# Patient Record
Sex: Male | Born: 1960 | Race: White | Hispanic: No | Marital: Married | Smoking: Former smoker
Health system: Southern US, Community
[De-identification: ages and names within clinical notes are randomized; demographics above are authoritative.]

## PROBLEM LIST (undated history)

## (undated) DIAGNOSIS — G4730 Sleep apnea, unspecified: Secondary | ICD-10-CM

## (undated) DIAGNOSIS — E119 Type 2 diabetes mellitus without complications: Secondary | ICD-10-CM

## (undated) DIAGNOSIS — F419 Anxiety disorder, unspecified: Secondary | ICD-10-CM

## (undated) DIAGNOSIS — M1990 Unspecified osteoarthritis, unspecified site: Secondary | ICD-10-CM

## (undated) DIAGNOSIS — F32A Depression, unspecified: Secondary | ICD-10-CM

## (undated) DIAGNOSIS — I10 Essential (primary) hypertension: Secondary | ICD-10-CM

## (undated) DIAGNOSIS — F329 Major depressive disorder, single episode, unspecified: Secondary | ICD-10-CM

## (undated) HISTORY — PX: TONSILLECTOMY: SUR1361

---

## 2012-12-01 HISTORY — PX: SHOULDER ARTHROSCOPY W/ ROTATOR CUFF REPAIR: SHX2400

## 2017-05-21 DIAGNOSIS — M542 Cervicalgia: Secondary | ICD-10-CM | POA: Diagnosis not present

## 2017-05-21 DIAGNOSIS — M9903 Segmental and somatic dysfunction of lumbar region: Secondary | ICD-10-CM | POA: Diagnosis not present

## 2017-05-21 DIAGNOSIS — M5416 Radiculopathy, lumbar region: Secondary | ICD-10-CM | POA: Diagnosis not present

## 2017-05-21 DIAGNOSIS — M9905 Segmental and somatic dysfunction of pelvic region: Secondary | ICD-10-CM | POA: Diagnosis not present

## 2017-07-15 DIAGNOSIS — E1169 Type 2 diabetes mellitus with other specified complication: Secondary | ICD-10-CM | POA: Diagnosis not present

## 2017-07-15 DIAGNOSIS — E669 Obesity, unspecified: Secondary | ICD-10-CM | POA: Diagnosis not present

## 2017-07-15 DIAGNOSIS — E785 Hyperlipidemia, unspecified: Secondary | ICD-10-CM | POA: Diagnosis not present

## 2017-07-15 DIAGNOSIS — I10 Essential (primary) hypertension: Secondary | ICD-10-CM | POA: Diagnosis not present

## 2017-07-15 DIAGNOSIS — K709 Alcoholic liver disease, unspecified: Secondary | ICD-10-CM | POA: Diagnosis not present

## 2017-08-05 DIAGNOSIS — H401131 Primary open-angle glaucoma, bilateral, mild stage: Secondary | ICD-10-CM | POA: Diagnosis not present

## 2017-08-25 DIAGNOSIS — M9903 Segmental and somatic dysfunction of lumbar region: Secondary | ICD-10-CM | POA: Diagnosis not present

## 2017-08-25 DIAGNOSIS — M542 Cervicalgia: Secondary | ICD-10-CM | POA: Diagnosis not present

## 2017-08-25 DIAGNOSIS — M5416 Radiculopathy, lumbar region: Secondary | ICD-10-CM | POA: Diagnosis not present

## 2017-08-25 DIAGNOSIS — M9905 Segmental and somatic dysfunction of pelvic region: Secondary | ICD-10-CM | POA: Diagnosis not present

## 2017-08-26 DIAGNOSIS — Z23 Encounter for immunization: Secondary | ICD-10-CM | POA: Diagnosis not present

## 2017-08-26 DIAGNOSIS — M5431 Sciatica, right side: Secondary | ICD-10-CM | POA: Diagnosis not present

## 2017-08-26 DIAGNOSIS — M545 Low back pain: Secondary | ICD-10-CM | POA: Diagnosis not present

## 2017-08-26 DIAGNOSIS — G8929 Other chronic pain: Secondary | ICD-10-CM | POA: Diagnosis not present

## 2017-08-28 DIAGNOSIS — G8929 Other chronic pain: Secondary | ICD-10-CM | POA: Diagnosis not present

## 2017-08-28 DIAGNOSIS — S239XXA Sprain of unspecified parts of thorax, initial encounter: Secondary | ICD-10-CM | POA: Diagnosis not present

## 2017-08-28 DIAGNOSIS — M545 Low back pain: Secondary | ICD-10-CM | POA: Diagnosis not present

## 2017-08-28 DIAGNOSIS — M5431 Sciatica, right side: Secondary | ICD-10-CM | POA: Diagnosis not present

## 2017-08-29 DIAGNOSIS — M5431 Sciatica, right side: Secondary | ICD-10-CM | POA: Diagnosis not present

## 2017-08-29 DIAGNOSIS — M549 Dorsalgia, unspecified: Secondary | ICD-10-CM | POA: Diagnosis not present

## 2017-09-07 DIAGNOSIS — M545 Low back pain: Secondary | ICD-10-CM | POA: Diagnosis not present

## 2017-09-07 DIAGNOSIS — M5431 Sciatica, right side: Secondary | ICD-10-CM | POA: Diagnosis not present

## 2017-09-12 DIAGNOSIS — M549 Dorsalgia, unspecified: Secondary | ICD-10-CM | POA: Diagnosis not present

## 2017-09-12 DIAGNOSIS — M5416 Radiculopathy, lumbar region: Secondary | ICD-10-CM | POA: Diagnosis not present

## 2017-09-12 DIAGNOSIS — G894 Chronic pain syndrome: Secondary | ICD-10-CM | POA: Diagnosis not present

## 2017-09-12 DIAGNOSIS — M545 Low back pain: Secondary | ICD-10-CM | POA: Diagnosis not present

## 2017-09-15 DIAGNOSIS — M4316 Spondylolisthesis, lumbar region: Secondary | ICD-10-CM | POA: Diagnosis not present

## 2017-09-17 DIAGNOSIS — M4316 Spondylolisthesis, lumbar region: Secondary | ICD-10-CM | POA: Diagnosis not present

## 2017-09-17 DIAGNOSIS — M545 Low back pain: Secondary | ICD-10-CM | POA: Diagnosis not present

## 2017-09-18 DIAGNOSIS — M4316 Spondylolisthesis, lumbar region: Secondary | ICD-10-CM | POA: Diagnosis not present

## 2017-09-23 DIAGNOSIS — M5431 Sciatica, right side: Secondary | ICD-10-CM | POA: Diagnosis not present

## 2017-09-23 DIAGNOSIS — M4310 Spondylolisthesis, site unspecified: Secondary | ICD-10-CM | POA: Diagnosis not present

## 2017-10-01 DIAGNOSIS — M9905 Segmental and somatic dysfunction of pelvic region: Secondary | ICD-10-CM | POA: Diagnosis not present

## 2017-10-01 DIAGNOSIS — M9903 Segmental and somatic dysfunction of lumbar region: Secondary | ICD-10-CM | POA: Diagnosis not present

## 2017-10-01 DIAGNOSIS — M542 Cervicalgia: Secondary | ICD-10-CM | POA: Diagnosis not present

## 2017-10-01 DIAGNOSIS — M5416 Radiculopathy, lumbar region: Secondary | ICD-10-CM | POA: Diagnosis not present

## 2017-10-07 DIAGNOSIS — M9983 Other biomechanical lesions of lumbar region: Secondary | ICD-10-CM | POA: Diagnosis not present

## 2017-10-07 DIAGNOSIS — M48061 Spinal stenosis, lumbar region without neurogenic claudication: Secondary | ICD-10-CM | POA: Diagnosis not present

## 2017-10-07 DIAGNOSIS — M5416 Radiculopathy, lumbar region: Secondary | ICD-10-CM | POA: Diagnosis not present

## 2017-10-07 DIAGNOSIS — M4306 Spondylolysis, lumbar region: Secondary | ICD-10-CM | POA: Diagnosis not present

## 2017-10-07 DIAGNOSIS — G894 Chronic pain syndrome: Secondary | ICD-10-CM | POA: Diagnosis not present

## 2017-10-12 DIAGNOSIS — I10 Essential (primary) hypertension: Secondary | ICD-10-CM | POA: Diagnosis not present

## 2017-10-12 DIAGNOSIS — M4310 Spondylolisthesis, site unspecified: Secondary | ICD-10-CM | POA: Diagnosis not present

## 2017-10-12 DIAGNOSIS — M7138 Other bursal cyst, other site: Secondary | ICD-10-CM | POA: Diagnosis not present

## 2017-10-14 DIAGNOSIS — M4307 Spondylolysis, lumbosacral region: Secondary | ICD-10-CM | POA: Diagnosis not present

## 2017-10-14 DIAGNOSIS — I10 Essential (primary) hypertension: Secondary | ICD-10-CM | POA: Diagnosis not present

## 2017-10-14 DIAGNOSIS — M7138 Other bursal cyst, other site: Secondary | ICD-10-CM | POA: Diagnosis not present

## 2017-10-14 DIAGNOSIS — M4310 Spondylolisthesis, site unspecified: Secondary | ICD-10-CM | POA: Diagnosis not present

## 2017-10-16 ENCOUNTER — Other Ambulatory Visit: Payer: Self-pay | Admitting: Neurosurgery

## 2017-10-19 DIAGNOSIS — W19XXXA Unspecified fall, initial encounter: Secondary | ICD-10-CM | POA: Diagnosis not present

## 2017-10-19 DIAGNOSIS — S2241XA Multiple fractures of ribs, right side, initial encounter for closed fracture: Secondary | ICD-10-CM | POA: Diagnosis not present

## 2017-11-05 NOTE — Pre-Procedure Instructions (Signed)
Larry GrillsCraig Mora  11/05/2017      [address] [address] - [place], N - [place], [place] - [address] [place] [address] [place] [address] Phone: [phone] Fax: [phone]    Your procedure is scheduled on Dec 11  [facility] at 950 A.M.  Call this number if you have problems the morning of surgery:  [phone]   Remember:  Do not eat food or drink liquids after midnight.  Take these medicines the morning of surgery with A SIP OF WATER Escitalopram (lexapro), Metoprolol Succinate (Toprol-XL)  Stop taking aspirin, BC's, Goody's, Herbal medications, Fish Oil, Ibuprofen, Advil, Motrin, Aleve    How to Manage Your Diabetes Before and After Surgery  Why is it important to control my blood sugar before and after surgery? . Improving blood sugar levels before and after surgery helps healing and can limit problems. . A way of improving blood sugar control is eating a healthy diet by: o  Eating less sugar and carbohydrates o  Increasing activity/exercise o  Talking with your doctor about reaching your blood sugar goals . High blood sugars (greater than 180 mg/dL) can raise your risk of infections and slow your recovery, so you will need to focus on controlling your diabetes during the weeks before surgery. . Make sure that the doctor who takes care of your diabetes knows about your planned surgery including the date and location.  How do I manage my blood sugar before surgery? . Check your blood sugar at least 4 times a day, starting 2 days before surgery, to make sure that the level is not too high or low. o Check your blood sugar the morning of your surgery when you wake up and every 2 hours until you get to the Short Stay unit. . If your blood sugar is less than 70 mg/dL, you will need to treat for low blood sugar: o Do not take insulin. o Treat a low blood sugar (less than 70 mg/dL) with  cup of clear juice (cranberry or apple), 4 glucose tablets,  OR glucose gel. Recheck blood sugar in 15 minutes after treatment (to make sure it is greater than 70 mg/dL). If your blood sugar is not greater than 70 mg/dL on recheck, call [phone] o  for further instructions. . Report your blood sugar to the short stay nurse when you get to Short Stay.  . If you are admitted to the hospital after surgery: o Your blood sugar will be checked by the staff and you will probably be given insulin after surgery (instead of oral diabetes medicines) to make sure you have good blood sugar levels. o The goal for blood sugar control after surgery is 80-180 mg/dL.              WHAT DO I DO ABOUT MY DIABETES MEDICATION?   Marland Kitchen. Do not take oral diabetes medicines (pills) the morning of surgery.  . THE NIGHT BEFORE SURGERY, take ___________ units of ___________insulin.       Marland Kitchen. HE MORNING OF SURGERY, take _____________ units of __________insulin.  . The day of surgery, do not take other diabetes injectables, including Byetta (exenatide), Bydureon (exenatide ER), Victoza (liraglutide), or Trulicity (dulaglutide).  . If your CBG is greater than 220 mg/dL, you may take  of your sliding scale (correction) dose of insulin.  Other Instructions:          Patient Signature:  Date:   Nurse Signature:  Date:   Reviewed and Endorsed by [facility] Patient Education Committee, August 2015  Do not wear jewelry, make-up or nail polish.  Do not wear lotions, powders, or perfumes, or deoderant.  Do not shave 48 hours prior to surgery.  Men may shave face and neck.  Do not bring valuables to the hospital.  [facility] is not responsible for any belongings or valuables.  Contacts, dentures or bridgework may not be worn into surgery.  Leave your suitcase in the car.  After surgery it may be brought to your room.  For patients admitted to the hospital, discharge time will be determined by your treatment team.  Patients discharged the day of surgery will not  be allowed to drive home.    Special instructions:  [facility] - Preparing for Surgery  Before surgery, you can play an important role.  Because skin is not sterile, your skin needs to be as free of germs as possible.  You can reduce the number of germs on you skin by washing with CHG (chlorahexidine gluconate) soap before surgery.  CHG is an antiseptic cleaner which kills germs and bonds with the skin to continue killing germs even after washing.  Please DO NOT use if you have an allergy to CHG or antibacterial soaps.  If your skin becomes reddened/irritated stop using the CHG and inform your nurse when you arrive at Short Stay.  Do not shave (including legs and underarms) for at least 48 hours prior to the first CHG shower.  You may shave your face.  Please follow these instructions carefully:   1.  Shower with CHG Soap the night before surgery and the   morning of Surgery.  2.  If you choose to wash your hair, wash your hair first as usual with your  normal shampoo.  3.  After you shampoo, rinse your hair and body thoroughly to remove the  Shampoo.  4.  Use CHG as you would any other liquid soap.  You can apply chg directly to the skin and wash gently with scrungie or a clean washcloth.  5.  Apply the CHG Soap to your body ONLY FROM THE NECK DOWN.  Do not use on open wounds or open sores.  Avoid contact with your eyes,   ears, mouth and genitals (private parts).  Wash genitals (private parts)  with your normal soap.  6.  Wash thoroughly, paying special attention to the area where your surgery  will be performed.  7.  Thoroughly rinse your body with warm water from the neck down.  8.  DO NOT shower/wash with your normal soap after using and rinsing off the CHG Soap.  9.  Pat yourself dry with a clean towel.            10.  Wear clean pajamas.            11.  Place clean sheets on your bed the night of your first shower and do not sleep with pets.  Day of Surgery  Do not apply any  lotions/deoderants the morning of surgery.  Please wear clean clothes to the hospital/surgery center.     Please read over the following fact sheets that you were given. Pain Booklet, Coughing and Deep Breathing, MRSA Information and Surgical Site Infection Prevention

## 2017-11-06 ENCOUNTER — Other Ambulatory Visit: Payer: Self-pay

## 2017-11-06 ENCOUNTER — Encounter (HOSPITAL_COMMUNITY)
Admission: RE | Admit: 2017-11-06 | Discharge: 2017-11-06 | Disposition: A | Discharge status: Home / Self Care | Payer: BLUE CROSS/BLUE SHIELD | Source: Ambulatory Visit | Attending: Neurosurgery | Admitting: Neurosurgery

## 2017-11-06 ENCOUNTER — Encounter (HOSPITAL_COMMUNITY): Payer: Self-pay

## 2017-11-06 DIAGNOSIS — M4317 Spondylolisthesis, lumbosacral region: Secondary | ICD-10-CM | POA: Insufficient documentation

## 2017-11-06 DIAGNOSIS — Z01810 Encounter for preprocedural cardiovascular examination: Secondary | ICD-10-CM | POA: Insufficient documentation

## 2017-11-06 DIAGNOSIS — Z794 Long term (current) use of insulin: Secondary | ICD-10-CM | POA: Insufficient documentation

## 2017-11-06 DIAGNOSIS — Z01818 Encounter for other preprocedural examination: Secondary | ICD-10-CM | POA: Insufficient documentation

## 2017-11-06 DIAGNOSIS — Z79899 Other long term (current) drug therapy: Secondary | ICD-10-CM | POA: Insufficient documentation

## 2017-11-06 HISTORY — DX: Type 2 diabetes mellitus without complications: E11.9

## 2017-11-06 HISTORY — DX: Depression, unspecified: F32.A

## 2017-11-06 HISTORY — DX: Major depressive disorder, single episode, unspecified: F32.9

## 2017-11-06 HISTORY — DX: Sleep apnea, unspecified: G47.30

## 2017-11-06 HISTORY — DX: Unspecified osteoarthritis, unspecified site: M19.90

## 2017-11-06 HISTORY — DX: Essential (primary) hypertension: I10

## 2017-11-06 HISTORY — DX: Anxiety disorder, unspecified: F41.9

## 2017-11-06 LAB — BASIC METABOLIC PANEL
ANION GAP: 9 (ref 5–15)
BUN: 8 mg/dL (ref 6–20)
CALCIUM: 9.6 mg/dL (ref 8.9–10.3)
CO2: 28 mmol/L (ref 22–32)
Chloride: 95 mmol/L — ABNORMAL LOW (ref 101–111)
Creatinine, Ser: 0.62 mg/dL (ref 0.61–1.24)
GLUCOSE: 123 mg/dL — AB (ref 65–99)
Potassium: 4 mmol/L (ref 3.5–5.1)
Sodium: 132 mmol/L — ABNORMAL LOW (ref 135–145)

## 2017-11-06 LAB — CBC
HCT: 42 % (ref 39.0–52.0)
Hemoglobin: 14.7 g/dL (ref 13.0–17.0)
MCH: 30.2 pg (ref 26.0–34.0)
MCHC: 35 g/dL (ref 30.0–36.0)
MCV: 86.4 fL (ref 78.0–100.0)
PLATELETS: 334 10*3/uL (ref 150–400)
RBC: 4.86 MIL/uL (ref 4.22–5.81)
RDW: 15.7 % — AB (ref 11.5–15.5)
WBC: 7.3 10*3/uL (ref 4.0–10.5)

## 2017-11-06 LAB — ABO/RH: ABO/RH(D): A NEG

## 2017-11-06 LAB — TYPE AND SCREEN
ABO/RH(D): A NEG
Antibody Screen: NEGATIVE

## 2017-11-06 LAB — HEMOGLOBIN A1C
Hgb A1c MFr Bld: 5.2 % (ref 4.8–5.6)
MEAN PLASMA GLUCOSE: 102.54 mg/dL

## 2017-11-06 LAB — SURGICAL PCR SCREEN
MRSA, PCR: NEGATIVE
Staphylococcus aureus: NEGATIVE

## 2017-11-06 LAB — GLUCOSE, CAPILLARY: GLUCOSE-CAPILLARY: 118 mg/dL — AB (ref 65–99)

## 2017-11-06 NOTE — Pre-Procedure Instructions (Signed)
  Larry Mora  11/06/2017      [facility] - [place], [place] - [place], [place] - [address] [place] [address] Phone: [phone] Fax: [phone]    Your procedure is scheduled on Dec 11  Report to [facility] Admitting at 950 A.M.  Call this number if you have problems the morning of surgery:  [phone]   Remember:  Do not eat food or drink liquids after midnight.  Take these medicines the morning of surgery with A SIP OF WATER Escitalopram (lexapro), Metoprolol Succinate (Toprol-XL)  Stop taking aspirin, BC's, Goody's, Herbal medications, Fish Oil, Ibuprofen, Advil, Motrin, Aleve,naproxen   How to Manage Your Diabetes Before and After Surgery  Why is it important to control my blood sugar before and after surgery? . Improving blood sugar levels before and after surgery helps healing and can limit problems. . A way of improving blood sugar control is eating a healthy diet by: o  Eating less sugar and carbohydrates o  Increasing activity/exercise o  Talking with your doctor about reaching your blood sugar goals . High blood sugars (greater than 180 mg/dL) can raise your risk of infections and slow your recovery, so you will need to focus on controlling your diabetes during the weeks before surgery. . Make sure that the doctor who takes care of your diabetes knows about your planned surgery including the date and location.  How do I manage my blood sugar before surgery? . Check your blood sugar at least 4 times a day, starting 2 days before surgery, to make sure that the level is not too high or low. o Check your blood sugar the morning of your surgery when you wake up and every 2 hours until you get to the Short Stay unit. . If your blood sugar is less than 70 mg/dL, you will need to treat for low blood sugar: o Do not take insulin. o Treat a low blood sugar (less than 70 mg/dL) with  cup of clear juice (cranberry or apple), 4 glucose  tablets, OR glucose gel. Recheck blood sugar in 15 minutes after treatment (to make sure it is greater than 70 mg/dL). If your blood sugar is not greater than 70 mg/dL on recheck, call [phone] o  for further instructions. . Report your blood sugar to the short stay nurse when you get to Short Stay.  . If you are admitted to the hospital after surgery: o Your blood sugar will be checked by the staff and you will probably be given insulin after surgery (instead of oral diabetes medicines) to make sure you have good blood sugar levels. o The goal for blood sugar control after surgery is 80-180 mg/dL.   . THE MORNING OF SURGERY, take 2 units of lantus insulin. .    Do not wear jewelry, make-up or nail polish.  Do not wear lotions, powders, or perfumes, or deoderant.  Do not shave 48 hours prior to surgery.  Men may shave face and neck.  Do not bring valuables to the hospital.  [name] is not responsible for any belongings or valuables.  Contacts, dentures or bridgework may not be worn into surgery.  Leave your suitcase in the car.  After surgery it may be brought to your room.  For patients admitted to the hospital, discharge time will be determined by your treatment team.  Patients discharged the day of surgery will not be allowed to drive home.      Special instructions:  [place] - Preparing for Surgery  Before surgery, you can play an important role.  Because skin is not sterile, your skin needs to be as free of germs as possible.  You can reduce the number of germs on you skin by washing with CHG (chlorahexidine gluconate) soap before surgery.  CHG is an antiseptic cleaner which kills germs and bonds with the skin to continue killing germs even after washing.  Please DO NOT use if you have an allergy to CHG or antibacterial soaps.  If your skin becomes reddened/irritated stop using the CHG and inform your nurse when you arrive at Short Stay.  Do not shave (including legs  and underarms) for at least 48 hours prior to the first CHG shower.  You may shave your face.  Please follow these instructions carefully:   1.  Shower with CHG Soap the night before surgery and the   morning of Surgery.  2.  If you choose to wash your hair, wash your hair first as usual with your  normal shampoo.  3.  After you shampoo, rinse your hair and body thoroughly to remove the  Shampoo.  4.  Use CHG as you would any other liquid soap.  You can apply chg directly to the skin and wash gently with scrungie or a clean washcloth.  5.  Apply the CHG Soap to your body ONLY FROM THE NECK DOWN.  Do not use on open wounds or open sores.  Avoid contact with your eyes,   ears, mouth and genitals (private parts).  Wash genitals (private parts)  with your normal soap.  6.  Wash thoroughly, paying special attention to the area where your surgery  will be performed.  7.  Thoroughly rinse your body with warm water from the neck down.  8.  DO NOT shower/wash with your normal soap after using and rinsing off the CHG Soap.  9.  Pat yourself dry with a clean towel.            10.  Wear clean pajamas.            11.  Place clean sheets on your bed the night of your first shower and do not sleep with pets.  Day of Surgery  Do not apply any lotions/deoderants the morning of surgery.  Please wear clean clothes to the hospital/surgery center.     Please read over the  fact sheets that you were given.

## 2017-11-06 NOTE — Pre-Procedure Instructions (Signed)
Verlin GrillsCraig Harker  11/06/2017      [facility] [place], [place] - [place], [place] - [address] [place] [address] Phone: [phone] Fax: [phone]    Your procedure is scheduled on Dec 11  [facility] at 950 A.M.  Call this number if you have problems the morning of surgery:  [phone]   Remember:  Do not eat food or drink liquids after midnight.  Take these medicines the morning of surgery with A SIP OF WATER Escitalopram (lexapro), Metoprolol Succinate (Toprol-XL)  Stop taking aspirin, BC's, Goody's, Herbal medications, Fish Oil, Ibuprofen, Advil, Motrin, Aleve,naproxen   How to Manage Your Diabetes Before and After Surgery  Why is it important to control my blood sugar before and after surgery? . Improving blood sugar levels before and after surgery helps healing and can limit problems. . A way of improving blood sugar control is eating a healthy diet by: o  Eating less sugar and carbohydrates o  Increasing activity/exercise o  Talking with your doctor about reaching your blood sugar goals . High blood sugars (greater than 180 mg/dL) can raise your risk of infections and slow your recovery, so you will need to focus on controlling your diabetes during the weeks before surgery. . Make sure that the doctor who takes care of your diabetes knows about your planned surgery including the date and location.  How do I manage my blood sugar before surgery? . Check your blood sugar at least 4 times a day, starting 2 days before surgery, to make sure that the level is not too high or low. o Check your blood sugar the morning of your surgery when you wake up and every 2 hours until you get to the Short Stay unit. . If your blood sugar is less than 70 mg/dL, you will need to treat for low blood sugar: o Do not take insulin. o Treat a low blood sugar (less than 70 mg/dL) with  cup of clear juice (cranberry or apple), 4 glucose  tablets, OR glucose gel. Recheck blood sugar in 15 minutes after treatment (to make sure it is greater than 70 mg/dL). If your blood sugar is not greater than 70 mg/dL on recheck, call [phone] o  for further instructions. . Report your blood sugar to the short stay nurse when you get to Short Stay.  . If you are admitted to the hospital after surgery: o Your blood sugar will be checked by the staff and you will probably be given insulin after surgery (instead of oral diabetes medicines) to make sure you have good blood sugar levels. o The goal for blood sugar control after surgery is 80-180 mg/dL.   . THE MORNING OF SURGERY, take 2 units of lantus insulin. .    Do not wear jewelry, make-up or nail polish.  Do not wear lotions, powders, or perfumes, or deoderant.  Do not shave 48 hours prior to surgery.  Men may shave face and neck.  Do not bring valuables to the hospital.  [facility] is not responsible for any belongings or valuables.  Contacts, dentures or bridgework may not be worn into surgery.  Leave your suitcase in the car.  After surgery it may be brought to your room.  For patients admitted to the hospital, discharge time will be determined by your treatment team.  Patients discharged the day of surgery will not be allowed to drive home.  Special instructions:  [place] - Preparing for Surgery  Before surgery, you can play an important role.  Because skin is not sterile, your skin needs to be as free of germs as possible.  You can reduce the number of germs on you skin by washing with CHG (chlorahexidine gluconate) soap before surgery.  CHG is an antiseptic cleaner which kills germs and bonds with the skin to continue killing germs even after washing.  Please DO NOT use if you have an allergy to CHG or antibacterial soaps.  If your skin becomes reddened/irritated stop using the CHG and inform your nurse when you arrive at Short Stay.  Do not shave (including legs  and underarms) for at least 48 hours prior to the first CHG shower.  You may shave your face.  Please follow these instructions carefully:   1.  Shower with CHG Soap the night before surgery and the   morning of Surgery.  2.  If you choose to wash your hair, wash your hair first as usual with your  normal shampoo.  3.  After you shampoo, rinse your hair and body thoroughly to remove the  Shampoo.  4.  Use CHG as you would any other liquid soap.  You can apply chg directly to the skin and wash gently with scrungie or a clean washcloth.  5.  Apply the CHG Soap to your body ONLY FROM THE NECK DOWN.  Do not use on open wounds or open sores.  Avoid contact with your eyes,   ears, mouth and genitals (private parts).  Wash genitals (private parts)  with your normal soap.  6.  Wash thoroughly, paying special attention to the area where your surgery  will be performed.  7.  Thoroughly rinse your body with warm water from the neck down.  8.  DO NOT shower/wash with your normal soap after using and rinsing off the CHG Soap.  9.  Pat yourself dry with a clean towel.            10.  Wear clean pajamas.            11.  Place clean sheets on your bed the night of your first shower and do not sleep with pets.  Day of Surgery  Do not apply any lotions/deoderants the morning of surgery.  Please wear clean clothes to the hospital/surgery center.     Please read over the  fact sheets that you were given.

## 2017-11-09 ENCOUNTER — Encounter (HOSPITAL_COMMUNITY): Payer: Self-pay | Admitting: Anesthesiology

## 2017-11-09 NOTE — Anesthesia Preprocedure Evaluation (Addendum)
Anesthesia Evaluation  Patient identified by MRN, date of birth, ID band Patient awake    Reviewed: Allergy & Precautions, NPO status , Patient's Chart, lab work & pertinent test results  Airway Mallampati: I       Dental no notable dental hx. (+) Teeth Intact   Pulmonary former smoker,    Pulmonary exam normal breath sounds clear to auscultation       Cardiovascular hypertension, Pt. on medications and Pt. on home beta blockers Normal cardiovascular exam Rhythm:Regular Rate:Normal     Neuro/Psych PSYCHIATRIC DISORDERS Anxiety Depression    GI/Hepatic negative GI ROS, Neg liver ROS,   Endo/Other  diabetes, Insulin Dependent  Renal/GU negative Renal ROS  negative genitourinary   Musculoskeletal   Abdominal (+) + obese,   Peds  Hematology   Anesthesia Other Findings   Reproductive/Obstetrics                            Anesthesia Physical Anesthesia Plan  ASA: II  Anesthesia Plan: General   Post-op Pain Management:    Induction: Intravenous  PONV Risk Score and Plan: 3 and Midazolam, Dexamethasone and Ondansetron  Airway Management Planned: Oral ETT  Additional Equipment:   Intra-op Plan:   Post-operative Plan: Extubation in OR  Informed Consent: I have reviewed the patients History and Physical, chart, labs and discussed the procedure including the risks, benefits and alternatives for the proposed anesthesia with the patient or authorized representative who has indicated his/her understanding and acceptance.   Dental advisory given  Plan Discussed with: CRNA and Surgeon  Anesthesia Plan Comments:        Anesthesia Quick Evaluation

## 2017-11-10 ENCOUNTER — Inpatient Hospital Stay (HOSPITAL_COMMUNITY)
Admission: RE | Admit: 2017-11-10 | Discharge: 2017-11-11 | DRG: 455 | Disposition: A | Discharge status: Home / Self Care | Payer: BLUE CROSS/BLUE SHIELD | Source: Ambulatory Visit | Attending: Neurosurgery | Admitting: Neurosurgery

## 2017-11-10 ENCOUNTER — Inpatient Hospital Stay (HOSPITAL_COMMUNITY): Payer: BLUE CROSS/BLUE SHIELD | Admitting: Anesthesiology

## 2017-11-10 ENCOUNTER — Encounter (HOSPITAL_COMMUNITY)
Admission: RE | Disposition: A | Discharge status: Home / Self Care | Payer: Self-pay | Source: Ambulatory Visit | Attending: Neurosurgery

## 2017-11-10 ENCOUNTER — Encounter (HOSPITAL_COMMUNITY): Payer: Self-pay | Admitting: *Deleted

## 2017-11-10 ENCOUNTER — Inpatient Hospital Stay (HOSPITAL_COMMUNITY): Payer: BLUE CROSS/BLUE SHIELD

## 2017-11-10 DIAGNOSIS — M48061 Spinal stenosis, lumbar region without neurogenic claudication: Secondary | ICD-10-CM | POA: Diagnosis present

## 2017-11-10 DIAGNOSIS — F418 Other specified anxiety disorders: Secondary | ICD-10-CM | POA: Diagnosis not present

## 2017-11-10 DIAGNOSIS — F419 Anxiety disorder, unspecified: Secondary | ICD-10-CM | POA: Diagnosis not present

## 2017-11-10 DIAGNOSIS — I10 Essential (primary) hypertension: Secondary | ICD-10-CM | POA: Diagnosis not present

## 2017-11-10 DIAGNOSIS — Z419 Encounter for procedure for purposes other than remedying health state, unspecified: Secondary | ICD-10-CM

## 2017-11-10 DIAGNOSIS — M7138 Other bursal cyst, other site: Secondary | ICD-10-CM | POA: Diagnosis present

## 2017-11-10 DIAGNOSIS — G4730 Sleep apnea, unspecified: Secondary | ICD-10-CM | POA: Diagnosis present

## 2017-11-10 DIAGNOSIS — Z79899 Other long term (current) drug therapy: Secondary | ICD-10-CM | POA: Diagnosis not present

## 2017-11-10 DIAGNOSIS — M1990 Unspecified osteoarthritis, unspecified site: Secondary | ICD-10-CM | POA: Diagnosis present

## 2017-11-10 DIAGNOSIS — M4316 Spondylolisthesis, lumbar region: Secondary | ICD-10-CM | POA: Diagnosis present

## 2017-11-10 DIAGNOSIS — E119 Type 2 diabetes mellitus without complications: Secondary | ICD-10-CM | POA: Diagnosis not present

## 2017-11-10 DIAGNOSIS — Z87891 Personal history of nicotine dependence: Secondary | ICD-10-CM | POA: Diagnosis not present

## 2017-11-10 DIAGNOSIS — Z794 Long term (current) use of insulin: Secondary | ICD-10-CM | POA: Diagnosis not present

## 2017-11-10 DIAGNOSIS — F329 Major depressive disorder, single episode, unspecified: Secondary | ICD-10-CM | POA: Diagnosis present

## 2017-11-10 DIAGNOSIS — M4327 Fusion of spine, lumbosacral region: Secondary | ICD-10-CM | POA: Diagnosis not present

## 2017-11-10 DIAGNOSIS — M5416 Radiculopathy, lumbar region: Secondary | ICD-10-CM | POA: Diagnosis present

## 2017-11-10 DIAGNOSIS — M4317 Spondylolisthesis, lumbosacral region: Secondary | ICD-10-CM | POA: Diagnosis not present

## 2017-11-10 DIAGNOSIS — M5417 Radiculopathy, lumbosacral region: Secondary | ICD-10-CM | POA: Diagnosis not present

## 2017-11-10 DIAGNOSIS — M545 Low back pain: Secondary | ICD-10-CM | POA: Diagnosis not present

## 2017-11-10 LAB — GLUCOSE, CAPILLARY
Glucose-Capillary: 108 mg/dL — ABNORMAL HIGH (ref 65–99)
Glucose-Capillary: 146 mg/dL — ABNORMAL HIGH (ref 65–99)
Glucose-Capillary: 177 mg/dL — ABNORMAL HIGH (ref 65–99)
Glucose-Capillary: 172 mg/dL — ABNORMAL HIGH (ref 65–99)

## 2017-11-10 SURGERY — POSTERIOR LUMBAR FUSION 1 LEVEL
Anesthesia: General | Site: Spine Lumbar

## 2017-11-10 MED ORDER — MIDAZOLAM HCL 5 MG/5ML IJ SOLN
INTRAMUSCULAR | Status: DC | PRN
Start: 2017-11-10 — End: 2017-11-10
  Administered 2017-11-10: 2 mg via INTRAVENOUS

## 2017-11-10 MED ORDER — ACETAMINOPHEN 325 MG PO TABS: 325.0000 mg | ORAL_TABLET | ORAL | Status: DC | PRN

## 2017-11-10 MED ORDER — THROMBIN (RECOMBINANT) 5000 UNITS EX SOLR
CUTANEOUS | Status: AC
Start: 2017-11-10 — End: ?
  Filled 2017-11-10: qty 5000

## 2017-11-10 MED ORDER — EPHEDRINE SULFATE 50 MG/ML IJ SOLN
INTRAMUSCULAR | Status: DC | PRN
  Administered 2017-11-10: 5 mg via INTRAVENOUS

## 2017-11-10 MED ORDER — SENNOSIDES-DOCUSATE SODIUM 8.6-50 MG PO TABS: 1.0000 | ORAL_TABLET | Freq: Every evening | ORAL | Status: DC | PRN

## 2017-11-10 MED ORDER — HYDROCODONE-ACETAMINOPHEN 5-325 MG PO TABS: 1.0000 | ORAL_TABLET | ORAL | Status: DC | PRN

## 2017-11-10 MED ORDER — METHOCARBAMOL 1000 MG/10ML IJ SOLN: 500.0000 mg | Freq: Four times a day (QID) | INTRAVENOUS | Status: DC | PRN

## 2017-11-10 MED ORDER — ONDANSETRON HCL 4 MG/2ML IJ SOLN
INTRAMUSCULAR | Status: AC
Start: 2017-11-10 — End: ?
  Filled 2017-11-10: qty 2

## 2017-11-10 MED ORDER — SODIUM CHLORIDE 0.9 % IV SOLN: INTRAVENOUS | Status: DC

## 2017-11-10 MED ORDER — LIDOCAINE 2% (20 MG/ML) 5 ML SYRINGE
INTRAMUSCULAR | Status: AC
  Filled 2017-11-10: qty 5

## 2017-11-10 MED ORDER — DEXAMETHASONE SODIUM PHOSPHATE 10 MG/ML IJ SOLN
INTRAMUSCULAR | Status: DC | PRN
  Administered 2017-11-10: 10 mg via INTRAVENOUS

## 2017-11-10 MED ORDER — ESCITALOPRAM OXALATE 10 MG PO TABS
10.0000 mg | ORAL_TABLET | Freq: Every day | ORAL | Status: DC
  Administered 2017-11-11: 10 mg via ORAL
  Filled 2017-11-10: qty 1

## 2017-11-10 MED ORDER — THROMBIN (RECOMBINANT) 5000 UNITS EX SOLR
CUTANEOUS | Status: DC | PRN
  Administered 2017-11-10 (×2): 5000 [IU] via TOPICAL

## 2017-11-10 MED ORDER — FENTANYL CITRATE (PF) 250 MCG/5ML IJ SOLN
INTRAMUSCULAR | Status: AC
  Filled 2017-11-10: qty 5

## 2017-11-10 MED ORDER — OXYCODONE HCL 5 MG PO TABS
10.0000 mg | ORAL_TABLET | ORAL | Status: DC | PRN
  Administered 2017-11-10 – 2017-11-11 (×5): 10 mg via ORAL
  Filled 2017-11-10 (×5): qty 2

## 2017-11-10 MED ORDER — DOCUSATE SODIUM 100 MG PO CAPS
100.0000 mg | ORAL_CAPSULE | Freq: Two times a day (BID) | ORAL | Status: DC
  Administered 2017-11-10 – 2017-11-11 (×3): 100 mg via ORAL
  Filled 2017-11-10 (×3): qty 1

## 2017-11-10 MED ORDER — ACETAMINOPHEN 325 MG PO TABS: 650.0000 mg | ORAL_TABLET | ORAL | Status: DC | PRN

## 2017-11-10 MED ORDER — PROPOFOL 10 MG/ML IV BOLUS
INTRAVENOUS | Status: AC
Start: 2017-11-10 — End: ?
  Filled 2017-11-10: qty 20

## 2017-11-10 MED ORDER — OXYCODONE HCL 5 MG/5ML PO SOLN: 5.0000 mg | Freq: Once | ORAL | Status: DC | PRN

## 2017-11-10 MED ORDER — CEFAZOLIN SODIUM-DEXTROSE 2-4 GM/100ML-% IV SOLN
2.0000 g | INTRAVENOUS | Status: AC
  Administered 2017-11-10: 2 g via INTRAVENOUS

## 2017-11-10 MED ORDER — SUGAMMADEX SODIUM 200 MG/2ML IV SOLN
INTRAVENOUS | Status: AC
Start: 2017-11-10 — End: ?
  Filled 2017-11-10: qty 2

## 2017-11-10 MED ORDER — ONDANSETRON HCL 4 MG PO TABS: 4.0000 mg | ORAL_TABLET | Freq: Four times a day (QID) | ORAL | Status: DC | PRN

## 2017-11-10 MED ORDER — ONDANSETRON HCL 4 MG/2ML IJ SOLN: 4.0000 mg | Freq: Once | INTRAMUSCULAR | Status: DC | PRN

## 2017-11-10 MED ORDER — SODIUM CHLORIDE 0.9% FLUSH: 3.0000 mL | INTRAVENOUS | Status: DC | PRN

## 2017-11-10 MED ORDER — FENTANYL CITRATE (PF) 100 MCG/2ML IJ SOLN
INTRAMUSCULAR | Status: DC | PRN
  Administered 2017-11-10 (×4): 50 ug via INTRAVENOUS
  Administered 2017-11-10: 150 ug via INTRAVENOUS
  Administered 2017-11-10: 50 ug via INTRAVENOUS

## 2017-11-10 MED ORDER — LACTATED RINGERS IV SOLN
INTRAVENOUS | Status: DC | PRN
  Administered 2017-11-10 (×3): via INTRAVENOUS

## 2017-11-10 MED ORDER — OXYCODONE HCL ER 10 MG PO T12A
10.0000 mg | EXTENDED_RELEASE_TABLET | Freq: Two times a day (BID) | ORAL | Status: DC
  Administered 2017-11-10 – 2017-11-11 (×3): 10 mg via ORAL
  Filled 2017-11-10 (×3): qty 1

## 2017-11-10 MED ORDER — HEMOSTATIC AGENTS (NO CHARGE) OPTIME
TOPICAL | Status: DC | PRN
  Administered 2017-11-10 (×2): 1 via TOPICAL

## 2017-11-10 MED ORDER — THROMBIN (RECOMBINANT) 20000 UNITS EX SOLR
CUTANEOUS | Status: AC
Start: 2017-11-10 — End: ?
  Filled 2017-11-10: qty 20000

## 2017-11-10 MED ORDER — PHENYLEPHRINE HCL 10 MG/ML IJ SOLN
INTRAMUSCULAR | Status: DC | PRN
  Administered 2017-11-10: 80 ug via INTRAVENOUS
  Administered 2017-11-10: 40 ug via INTRAVENOUS
  Administered 2017-11-10 (×3): 80 ug via INTRAVENOUS

## 2017-11-10 MED ORDER — MENTHOL 3 MG MT LOZG: 1.0000 | LOZENGE | OROMUCOSAL | Status: DC | PRN

## 2017-11-10 MED ORDER — ZOLPIDEM TARTRATE 5 MG PO TABS
5.0000 mg | ORAL_TABLET | Freq: Every evening | ORAL | Status: DC | PRN
  Administered 2017-11-10: 5 mg via ORAL
  Filled 2017-11-10: qty 1

## 2017-11-10 MED ORDER — CEFAZOLIN SODIUM-DEXTROSE 2-4 GM/100ML-% IV SOLN
INTRAVENOUS | Status: AC
  Filled 2017-11-10: qty 100

## 2017-11-10 MED ORDER — CHLORHEXIDINE GLUCONATE CLOTH 2 % EX PADS: 6.0000 | MEDICATED_PAD | Freq: Once | CUTANEOUS | Status: DC

## 2017-11-10 MED ORDER — LISINOPRIL 20 MG PO TABS
20.0000 mg | ORAL_TABLET | Freq: Every day | ORAL | Status: DC
  Administered 2017-11-11: 20 mg via ORAL
  Filled 2017-11-10: qty 1

## 2017-11-10 MED ORDER — ONDANSETRON HCL 4 MG/2ML IJ SOLN: 4.0000 mg | Freq: Four times a day (QID) | INTRAMUSCULAR | Status: DC | PRN

## 2017-11-10 MED ORDER — KETOROLAC TROMETHAMINE 30 MG/ML IJ SOLN: 30.0000 mg | Freq: Once | INTRAMUSCULAR | Status: DC | PRN

## 2017-11-10 MED ORDER — PHENOL 1.4 % MT LIQD: 1.0000 | OROMUCOSAL | Status: DC | PRN

## 2017-11-10 MED ORDER — SENNA 8.6 MG PO TABS
1.0000 | ORAL_TABLET | Freq: Two times a day (BID) | ORAL | Status: DC
  Administered 2017-11-10 – 2017-11-11 (×2): 8.6 mg via ORAL
  Filled 2017-11-10 (×2): qty 1

## 2017-11-10 MED ORDER — LACTATED RINGERS IV SOLN
INTRAVENOUS | Status: DC
  Administered 2017-11-10: 07:00:00 via INTRAVENOUS

## 2017-11-10 MED ORDER — DEXAMETHASONE SODIUM PHOSPHATE 10 MG/ML IJ SOLN
INTRAMUSCULAR | Status: AC
Start: 2017-11-10 — End: ?
  Filled 2017-11-10: qty 1

## 2017-11-10 MED ORDER — ATORVASTATIN CALCIUM 20 MG PO TABS
40.0000 mg | ORAL_TABLET | Freq: Every day | ORAL | Status: DC
  Administered 2017-11-10: 40 mg via ORAL
  Filled 2017-11-10: qty 2

## 2017-11-10 MED ORDER — OXYCODONE HCL 5 MG PO TABS: 5.0000 mg | ORAL_TABLET | Freq: Once | ORAL | Status: DC | PRN

## 2017-11-10 MED ORDER — 0.9 % SODIUM CHLORIDE (POUR BTL) OPTIME
TOPICAL | Status: DC | PRN
  Administered 2017-11-10: 1000 mL

## 2017-11-10 MED ORDER — MEPERIDINE HCL 25 MG/ML IJ SOLN: 6.2500 mg | INTRAMUSCULAR | Status: DC | PRN

## 2017-11-10 MED ORDER — ACETAMINOPHEN 500 MG PO TABS
1000.0000 mg | ORAL_TABLET | Freq: Four times a day (QID) | ORAL | Status: AC
  Administered 2017-11-10 – 2017-11-11 (×4): 1000 mg via ORAL
  Filled 2017-11-10 (×4): qty 2

## 2017-11-10 MED ORDER — FENTANYL CITRATE (PF) 100 MCG/2ML IJ SOLN
25.0000 ug | INTRAMUSCULAR | Status: DC | PRN
  Administered 2017-11-10 (×2): 50 ug via INTRAVENOUS

## 2017-11-10 MED ORDER — FENTANYL CITRATE (PF) 100 MCG/2ML IJ SOLN
INTRAMUSCULAR | Status: AC
Start: 2017-11-10 — End: 2017-11-11
  Filled 2017-11-10: qty 2

## 2017-11-10 MED ORDER — FLEET ENEMA 7-19 GM/118ML RE ENEM: 1.0000 | ENEMA | Freq: Once | RECTAL | Status: DC | PRN

## 2017-11-10 MED ORDER — THROMBIN (RECOMBINANT) 5000 UNITS EX SOLR
CUTANEOUS | Status: AC
  Filled 2017-11-10: qty 5000

## 2017-11-10 MED ORDER — SODIUM CHLORIDE 0.9% FLUSH
3.0000 mL | Freq: Two times a day (BID) | INTRAVENOUS | Status: DC
  Administered 2017-11-10 (×2): 3 mL via INTRAVENOUS

## 2017-11-10 MED ORDER — METHOCARBAMOL 500 MG PO TABS
500.0000 mg | ORAL_TABLET | Freq: Four times a day (QID) | ORAL | Status: DC | PRN
  Administered 2017-11-10 – 2017-11-11 (×3): 500 mg via ORAL
  Filled 2017-11-10 (×3): qty 1

## 2017-11-10 MED ORDER — MIDAZOLAM HCL 2 MG/2ML IJ SOLN
INTRAMUSCULAR | Status: AC
Start: 2017-11-10 — End: ?
  Filled 2017-11-10: qty 2

## 2017-11-10 MED ORDER — GABAPENTIN 300 MG PO CAPS
300.0000 mg | ORAL_CAPSULE | Freq: Three times a day (TID) | ORAL | Status: DC
  Administered 2017-11-10 – 2017-11-11 (×3): 300 mg via ORAL
  Filled 2017-11-10 (×3): qty 1

## 2017-11-10 MED ORDER — PROPOFOL 10 MG/ML IV BOLUS
INTRAVENOUS | Status: DC | PRN
Start: 2017-11-10 — End: 2017-11-10
  Administered 2017-11-10: 200 mg via INTRAVENOUS

## 2017-11-10 MED ORDER — ACETAMINOPHEN 650 MG RE SUPP: 650.0000 mg | RECTAL | Status: DC | PRN

## 2017-11-10 MED ORDER — CEFAZOLIN SODIUM-DEXTROSE 2-4 GM/100ML-% IV SOLN
2.0000 g | Freq: Three times a day (TID) | INTRAVENOUS | Status: AC
Start: 2017-11-10 — End: 2017-11-11
  Administered 2017-11-10 – 2017-11-11 (×2): 2 g via INTRAVENOUS
  Filled 2017-11-10 (×2): qty 100

## 2017-11-10 MED ORDER — ONDANSETRON HCL 4 MG/2ML IJ SOLN
INTRAMUSCULAR | Status: DC | PRN
  Administered 2017-11-10: 4 mg via INTRAVENOUS

## 2017-11-10 MED ORDER — SODIUM CHLORIDE 0.9 % IV SOLN: 250.0000 mL | INTRAVENOUS | Status: DC

## 2017-11-10 MED ORDER — LIDOCAINE HCL (CARDIAC) 20 MG/ML IV SOLN
INTRAVENOUS | Status: DC | PRN
  Administered 2017-11-10: 100 mg via INTRAVENOUS

## 2017-11-10 MED ORDER — LIDOCAINE-EPINEPHRINE 1 %-1:100000 IJ SOLN
INTRAMUSCULAR | Status: AC
Start: 2017-11-10 — End: ?
  Filled 2017-11-10: qty 1

## 2017-11-10 MED ORDER — METOPROLOL SUCCINATE ER 25 MG PO TB24
100.0000 mg | ORAL_TABLET | Freq: Every day | ORAL | Status: DC
  Administered 2017-11-11: 100 mg via ORAL
  Filled 2017-11-10: qty 4

## 2017-11-10 MED ORDER — BUPIVACAINE HCL (PF) 0.5 % IJ SOLN
INTRAMUSCULAR | Status: AC
  Filled 2017-11-10: qty 30

## 2017-11-10 MED ORDER — LIDOCAINE-EPINEPHRINE 1 %-1:100000 IJ SOLN
INTRAMUSCULAR | Status: DC | PRN
  Administered 2017-11-10: 5 mL

## 2017-11-10 MED ORDER — ROCURONIUM BROMIDE 10 MG/ML (PF) SYRINGE
PREFILLED_SYRINGE | INTRAVENOUS | Status: AC
  Filled 2017-11-10: qty 5

## 2017-11-10 MED ORDER — PHENYLEPHRINE HCL 10 MG/ML IJ SOLN
INTRAVENOUS | Status: DC | PRN
  Administered 2017-11-10: 40 ug/min via INTRAVENOUS
  Administered 2017-11-10: 30 ug/min via INTRAVENOUS

## 2017-11-10 MED ORDER — ACETAMINOPHEN 160 MG/5ML PO SOLN: 325.0000 mg | ORAL | Status: DC | PRN

## 2017-11-10 MED ORDER — BACITRACIN 50000 UNITS IM SOLR
INTRAMUSCULAR | Status: DC | PRN
  Administered 2017-11-10: 500 mL

## 2017-11-10 MED ORDER — HYDROMORPHONE HCL 1 MG/ML IJ SOLN
1.0000 mg | INTRAMUSCULAR | Status: DC | PRN
  Administered 2017-11-10: 1 mg via INTRAVENOUS
  Filled 2017-11-10: qty 1

## 2017-11-10 MED ORDER — BUPIVACAINE HCL (PF) 0.5 % IJ SOLN
INTRAMUSCULAR | Status: DC | PRN
  Administered 2017-11-10: 5 mL

## 2017-11-10 MED ORDER — BISACODYL 10 MG RE SUPP: 10.0000 mg | Freq: Every day | RECTAL | Status: DC | PRN

## 2017-11-10 MED ORDER — ROCURONIUM BROMIDE 100 MG/10ML IV SOLN
INTRAVENOUS | Status: DC | PRN
  Administered 2017-11-10: 20 mg via INTRAVENOUS
  Administered 2017-11-10: 60 mg via INTRAVENOUS
  Administered 2017-11-10 (×3): 10 mg via INTRAVENOUS
  Administered 2017-11-10: 40 mg via INTRAVENOUS
  Administered 2017-11-10: 10 mg via INTRAVENOUS

## 2017-11-10 MED ORDER — SUGAMMADEX SODIUM 200 MG/2ML IV SOLN
INTRAVENOUS | Status: DC | PRN
  Administered 2017-11-10: 200 mg via INTRAVENOUS

## 2017-11-10 SURGICAL SUPPLY — 80 items
BAG DECANTER FOR FLEXI CONT (MISCELLANEOUS) ×3 IMPLANT
BASKET BONE COLLECTION (BASKET) ×3 IMPLANT
BENZOIN TINCTURE PRP APPL 2/3 (GAUZE/BANDAGES/DRESSINGS) IMPLANT
BIT DRILL 3.5 POWEREASE (BIT) ×2 IMPLANT
BIT DRILL 3.5MM POWEREASE (BIT) ×1
BLADE CLIPPER SURG (BLADE) ×3 IMPLANT
BLADE SURG 11 STRL SS (BLADE) ×3 IMPLANT
BUR MATCHSTICK NEURO 3.0 LAGG (BURR) ×3 IMPLANT
BUR PRECISION FLUTE 5.0 (BURR) ×3 IMPLANT
CANISTER SUCT 3000ML PPV (MISCELLANEOUS) ×3 IMPLANT
CARTRIDGE OIL MAESTRO DRILL (MISCELLANEOUS) ×1 IMPLANT
CLOSURE WOUND 1/2 X4 (GAUZE/BANDAGES/DRESSINGS)
CONT SPEC 4OZ CLIKSEAL STRL BL (MISCELLANEOUS) ×3 IMPLANT
COVER BACK TABLE 60X90IN (DRAPES) ×3 IMPLANT
DECANTER SPIKE VIAL GLASS SM (MISCELLANEOUS) ×3 IMPLANT
DERMABOND ADVANCED (GAUZE/BANDAGES/DRESSINGS) ×2
DERMABOND ADVANCED .7 DNX12 (GAUZE/BANDAGES/DRESSINGS) ×1 IMPLANT
DEVICE INTERBODY ELEVATE 23X8 (Cage) ×4 IMPLANT
DIFFUSER DRILL AIR PNEUMATIC (MISCELLANEOUS) ×3 IMPLANT
DRAPE C-ARM 42X72 X-RAY (DRAPES) ×3 IMPLANT
DRAPE C-ARMOR (DRAPES) ×3 IMPLANT
DRAPE LAPAROTOMY 100X72X124 (DRAPES) ×3 IMPLANT
DRAPE POUCH INSTRU U-SHP 10X18 (DRAPES) ×3 IMPLANT
DRAPE SURG 17X23 STRL (DRAPES) ×3 IMPLANT
DRSG OPSITE POSTOP 4X6 (GAUZE/BANDAGES/DRESSINGS) ×3 IMPLANT
DURAPREP 26ML APPLICATOR (WOUND CARE) ×3 IMPLANT
ELECT REM PT RETURN 9FT ADLT (ELECTROSURGICAL) ×3
ELECTRODE REM PT RTRN 9FT ADLT (ELECTROSURGICAL) ×1 IMPLANT
GAUZE SPONGE 4X4 12PLY STRL (GAUZE/BANDAGES/DRESSINGS) IMPLANT
GAUZE SPONGE 4X4 16PLY XRAY LF (GAUZE/BANDAGES/DRESSINGS) ×3 IMPLANT
GLOVE BIO SURGEON STRL SZ7.5 (GLOVE) ×3 IMPLANT
GLOVE BIOGEL PI IND STRL 7.0 (GLOVE) ×1 IMPLANT
GLOVE BIOGEL PI IND STRL 7.5 (GLOVE) ×2 IMPLANT
GLOVE BIOGEL PI IND STRL 8 (GLOVE) ×6 IMPLANT
GLOVE BIOGEL PI INDICATOR 7.0 (GLOVE) ×2
GLOVE BIOGEL PI INDICATOR 7.5 (GLOVE) ×4
GLOVE BIOGEL PI INDICATOR 8 (GLOVE) ×12
GLOVE ECLIPSE 7.0 STRL STRAW (GLOVE) ×6 IMPLANT
GLOVE ECLIPSE 7.5 STRL STRAW (GLOVE) ×9 IMPLANT
GLOVE EXAM NITRILE LRG STRL (GLOVE) IMPLANT
GLOVE EXAM NITRILE XL STR (GLOVE) IMPLANT
GLOVE EXAM NITRILE XS STR PU (GLOVE) IMPLANT
GOWN STRL REUS W/ TWL LRG LVL3 (GOWN DISPOSABLE) ×2 IMPLANT
GOWN STRL REUS W/ TWL XL LVL3 (GOWN DISPOSABLE) IMPLANT
GOWN STRL REUS W/TWL 2XL LVL3 (GOWN DISPOSABLE) ×6 IMPLANT
GOWN STRL REUS W/TWL LRG LVL3 (GOWN DISPOSABLE) ×4
GOWN STRL REUS W/TWL XL LVL3 (GOWN DISPOSABLE)
HEMOSTAT POWDER KIT SURGIFOAM (HEMOSTASIS) ×6 IMPLANT
KIT BASIN OR (CUSTOM PROCEDURE TRAY) ×3 IMPLANT
KIT INFUSE SMALL (Orthopedic Implant) ×3 IMPLANT
KIT POSITION SURG JACKSON T1 (MISCELLANEOUS) ×3 IMPLANT
KIT ROOM TURNOVER OR (KITS) ×3 IMPLANT
MILL MEDIUM DISP (BLADE) ×3 IMPLANT
NEEDLE HYPO 18GX1.5 BLUNT FILL (NEEDLE) IMPLANT
NEEDLE HYPO 22GX1.5 SAFETY (NEEDLE) ×3 IMPLANT
NEEDLE SPNL 18GX3.5 QUINCKE PK (NEEDLE) IMPLANT
NS IRRIG 1000ML POUR BTL (IV SOLUTION) ×3 IMPLANT
OIL CARTRIDGE MAESTRO DRILL (MISCELLANEOUS) ×3
PACK LAMINECTOMY NEURO (CUSTOM PROCEDURE TRAY) ×3 IMPLANT
PAD ARMBOARD 7.5X6 YLW CONV (MISCELLANEOUS) ×9 IMPLANT
PATTIES SURGICAL 1X1 (DISPOSABLE) ×3 IMPLANT
ROD 45MM SOLERA PREBENT (Rod) ×6 IMPLANT
SCREW 6.5X40 (Screw) ×6 IMPLANT
SCREW SET SOLERA (Screw) ×8 IMPLANT
SCREW SET SOLERA TI5.5 (Screw) ×4 IMPLANT
SCREW SOLERA 45X5.5XMA NS SPNE (Screw) ×2 IMPLANT
SCREW SOLERA 5.5X45MM (Screw) ×4 IMPLANT
SPACER SPNL XLORDOTIC 23X8X (Cage) ×2 IMPLANT
SPCR SPNL XLORDOTIC 23X8X (Cage) ×2 IMPLANT
SPONGE LAP 4X18 X RAY DECT (DISPOSABLE) IMPLANT
SPONGE SURGIFOAM ABS GEL 100 (HEMOSTASIS) IMPLANT
STRIP CLOSURE SKIN 1/2X4 (GAUZE/BANDAGES/DRESSINGS) IMPLANT
SUT VIC AB 0 CT1 18XCR BRD8 (SUTURE) ×2 IMPLANT
SUT VIC AB 0 CT1 8-18 (SUTURE) ×4
SUT VICRYL 3-0 RB1 18 ABS (SUTURE) ×6 IMPLANT
SYR 3ML LL SCALE MARK (SYRINGE) IMPLANT
TOWEL GREEN STERILE (TOWEL DISPOSABLE) ×3 IMPLANT
TOWEL GREEN STERILE FF (TOWEL DISPOSABLE) ×3 IMPLANT
TRAY FOLEY W/METER SILVER 16FR (SET/KITS/TRAYS/PACK) ×3 IMPLANT
WATER STERILE IRR 1000ML POUR (IV SOLUTION) ×3 IMPLANT

## 2017-11-10 NOTE — Progress Notes (Signed)
Pt is on CPAP at this time per his home regimen settings. Pt is tolerating it well no distress or complications noted.

## 2017-11-10 NOTE — Anesthesia Procedure Notes (Signed)
Procedure Name: Intubation Date/Time: 11/10/2017 7:49 AM Performed by: Gaylene Brooks, CRNA Pre-anesthesia Checklist: Patient identified, Emergency Drugs available, Suction available and Patient being monitored Patient Re-evaluated:Patient Re-evaluated prior to induction Oxygen Delivery Method: Circle System Utilized Preoxygenation: Pre-oxygenation with 100% oxygen Induction Type: IV induction Ventilation: Mask ventilation without difficulty Laryngoscope Size: Mac and 4 Grade View: Grade I Tube type: Oral Tube size: 7.5 mm Number of attempts: 1 Airway Equipment and Method: Stylet Placement Confirmation: ETT inserted through vocal cords under direct vision,  positive ETCO2 and breath sounds checked- equal and bilateral Secured at: 21 cm Tube secured with: Tape Dental Injury: Teeth and Oropharynx as per pre-operative assessment  Comments: Intubation per Vickii Penna, SRNA

## 2017-11-10 NOTE — Evaluation (Signed)
Physical Therapy Evaluation Patient Details Name: Larry Mora MRN: [id number] DOB: 11-Aug-1961 Today's Date: 11/10/2017   History of Present Illness  Pt admitted for elective PLIFx1 level.  Clinical Impression  Patient is s/p above surgery resulting in the deficits listed below (see PT Problem List). Pt tolerating OOB well for first time. Patient will benefit from skilled PT to increase their independence and safety with mobility (while adhering to their precautions) to allow discharge to the venue listed below.     Follow Up Recommendations No PT follow up;Supervision - Intermittent    Equipment Recommendations  None recommended by PT    Recommendations for Other Services       Precautions / Restrictions Precautions Precautions: Back Precaution Booklet Issued: Yes (comment) Precaution Comments: pt with verbal understanding Required Braces or Orthoses: Spinal Brace Spinal Brace: Applied in sitting position Restrictions Weight Bearing Restrictions: No      Mobility  Bed Mobility Overal bed mobility: Needs Assistance Bed Mobility: Rolling;Sidelying to Sit;Sit to Sidelying Rolling: Modified independent (Device/Increase time) Sidelying to sit: Modified independent (Device/Increase time)     Sit to sidelying: Modified independent (Device/Increase time) General bed mobility comments: hob flat, used bed rail  Transfers Overall transfer level: Needs assistance   Transfers: Sit to/from Stand Sit to Stand: Min guard         General transfer comment: increased time  Ambulation/Gait Ambulation/Gait assistance: Min guard Ambulation Distance (Feet): 300 Feet Assistive device: None Gait Pattern/deviations: Step-through pattern;Decreased stride length Gait velocity: slow Gait velocity interpretation: Below normal speed for age/gender General Gait Details: no episode of LOB, encourage to take increased step length vs short shuffled steps  Stairs Stairs: Yes Stairs  assistance: Min guard Stair Management: One rail Right Number of Stairs: 2 General stair comments: step to descending, alternating step up  Wheelchair Mobility    Modified Rankin (Stroke Patients Only)       Balance Overall balance assessment: Needs assistance Sitting-balance support: Feet supported;No upper extremity supported Sitting balance-Leahy Scale: Good     Standing balance support: No upper extremity supported Standing balance-Leahy Scale: Fair                               Pertinent Vitals/Pain Pain Assessment: 0-10 Pain Score: 8  Pain Location: back Pain Descriptors / Indicators: Dull    Home Living Family/patient expects to be discharged to:: Private residence Living Arrangements: Spouse/significant other Available Help at Discharge: Family;Available 24 hours/day Type of Home: House Home Access: Stairs to enter Entrance Stairs-Rails: None Entrance Stairs-Number of Steps: 3 Home Layout: One level Home Equipment: Cane - single point      Prior Function Level of Independence: Independent               Hand Dominance   Dominant Hand: Right    Extremity/Trunk Assessment   Upper Extremity Assessment Upper Extremity Assessment: Overall WFL for tasks assessed    Lower Extremity Assessment Lower Extremity Assessment: Overall WFL for tasks assessed    Cervical / Trunk Assessment Cervical / Trunk Assessment: Other exceptions Cervical / Trunk Exceptions: back surgery  Communication   Communication: No difficulties  Cognition Arousal/Alertness: Awake/alert Behavior During Therapy: WFL for tasks assessed/performed Overall Cognitive Status: Within Functional Limits for tasks assessed  General Comments      Exercises     Assessment/Plan    PT Assessment Patient needs continued PT services  PT Problem List Decreased strength;Decreased range of motion;Decreased activity  tolerance;Decreased balance;Decreased mobility;Decreased coordination;Decreased cognition;Decreased knowledge of use of DME;Decreased safety awareness;Pain       PT Treatment Interventions DME instruction;Gait training;Stair training;Functional mobility training;Therapeutic activities;Therapeutic exercise;Balance training    PT Goals (Current goals can be found in the Care Plan section)  Acute Rehab PT Goals Patient Stated Goal: home PT Goal Formulation: With patient Time For Goal Achievement: 11/17/17 Potential to Achieve Goals: Good    Frequency Min 5X/week   Barriers to discharge        Co-evaluation               AM-PAC PT "6 Clicks" Daily Activity  Outcome Measure Difficulty turning over in bed (including adjusting bedclothes, sheets and blankets)?: Unable Difficulty moving from lying on back to sitting on the side of the bed? : Unable Difficulty sitting down on and standing up from a chair with arms (e.g., wheelchair, bedside commode, etc,.)?: A Little Help needed moving to and from a bed to chair (including a wheelchair)?: A Little Help needed walking in hospital room?: A Little Help needed climbing 3-5 steps with a railing? : A Little 6 Click Score: 14    End of Session Equipment Utilized During Treatment: Gait belt;Back brace Activity Tolerance: Patient tolerated treatment well Patient left: in bed;with call bell/phone within reach;with family/visitor present Nurse Communication: Mobility status PT Visit Diagnosis: Unsteadiness on feet (R26.81)    Time: [id number] PT Time Calculation (min) (ACUTE ONLY): 23 min   Charges:   PT Evaluation $PT Eval Moderate Complexity: 1 Mod PT Treatments $Gait Training: 8-22 mins   PT G Codes:        Lewis ShockAshly Shacarra Choe, PT, DPT Pager #: [phone] Office #: [phone]   Diva Lemberger M Noha Milberger 11/10/2017, 3:09 PM

## 2017-11-10 NOTE — H&P (Signed)
Chief Complaint  Back and leg pain   History of Present Illness  Larry Mora is a 56 y.o. male seen for the above. He complains of progressively worsening back and primarily right-sided leg pain. Pain started when he was bending over to look at the garbage disposal underneath a sink. Initially, he says his back "locked up." He currently describes pain across his lower back worsened with standing or walking, radiating to the right buttock and thigh and into the calf and big toe of his right foot. He has minimal symptoms in the left leg. 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 Surgical decompression was recommended. The patient therefore presents for elective procedure today.  Past Medical History   Past Medical History:  Diagnosis Date  . Anxiety   . Arthritis   . Depression   . Diabetes mellitus without complication (HCC)   . Hypertension   . Sleep apnea    cpap    Past Surgical History   Past Surgical History:  Procedure Laterality Date  . SHOULDER ARTHROSCOPY W/ ROTATOR CUFF REPAIR Left 2014  . TONSILLECTOMY      Social History   Social History   Tobacco Use  . Smoking status: Former Games developermoker  . Smokeless tobacco: Never Used  . Tobacco comment: 70's marajuna  Substance Use Topics  . Alcohol use: Yes    Comment: occ  . Drug use: No    Medications   Prior to Admission medications   Medication Sig Start Date End Date Taking? Authorizing Provider  atorvastatin (LIPITOR) 40 MG tablet Take 40 mg by mouth daily. 10/07/17  Yes [provider]  cyanocobalamin (,VITAMIN B-12,) 1000 MCG/ML injection Inject 1,000 mcg into the muscle every 30 (thirty) days. 10/07/17  Yes [provider]  escitalopram (LEXAPRO) 10 MG tablet Take 10 mg by mouth daily. 09/14/17  Yes [provider]  Insulin Glargine (LANTUS SOLOSTAR) 100 UNIT/ML Solostar Pen Inject 4 Units into the  skin daily.   Yes [provider]  latanoprost (XALATAN) 0.005 % ophthalmic solution Place 1 drop into both eyes at bedtime.   Yes [provider]  lisinopril (PRINIVIL,ZESTRIL) 20 MG tablet Take 20 mg by mouth daily. 10/07/17  Yes [provider]  metoprolol succinate (TOPROL-XL) 100 MG 24 hr tablet Take 100 mg by mouth daily. 10/07/17  Yes [provider]  Multiple Vitamins-Minerals (KP MENS DAILY PACK PO) Take 1 tablet by mouth daily.   Yes [provider]  Naproxen Sod-Diphenhydramine (ALEVE PM) 220-25 MG TABS Take 2 tablets by mouth at bedtime as needed (for sleep).   Yes [provider]    Allergies  No Known Allergies  Review of Systems  ROS  Neurologic Exam  Awake, alert, oriented Memory and concentration grossly intact Speech fluent, appropriate CN grossly intact Motor exam: Upper Extremities Deltoid Bicep Tricep Grip  Right 5/5 5/5 5/5 5/5  Left 5/5 5/5 5/5 5/5   Lower Extremities IP Quad PF DF EHL  Right 5/5 5/5 5/5 5/5 5/5  Left 5/5 5/5 5/5 5/5 5/5   Sensation grossly intact to LT  Imaging  MRI of the lumbar spine dated 09/17/2017 was reviewed. This demonstrates primary pathology at L5-S1 where there is bilateral L5 pars defects with associated grade 2 spondylolisthesis. There is significant bilateral facet arthropathy with broad-based disc bulge, and right-sided synovial cyst.  Dynamic lumbar spine x-rays dated 09/07/2017 were also  reviewed demonstrating mobility of the grade 2 spondylolisthesis at L5-S1.  Impression  - 56 y.o. 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  Plan  - We will plan on proceeding with L5-S1 decompression and instrumented fusion.  I have reviewed the risks, benefits, and alternatives to surgery with the patient and his family in detail in the office. All questions were answered and consent was obtained.

## 2017-11-10 NOTE — Op Note (Signed)
PREOP DIAGNOSIS:  1. Synovial Cyst, L5-S1 2. Lumbar radiculopathy, L5 3. Spondylolysis with spondylolisthesis grade 2, L5-S1  POSTOP DIAGNOSIS: Same  PROCEDURE: 1. L5 Gill procedure - laminectomy with radical facetectomy for decompression of exiting nerve roots, more than would be required for placement of interbody graft 2. Placement of anterior interbody device - Medtronic expandable 8mm x2 3. Posterior non-segmental instrumentation using Medtronic Solera pedicle screws at L5, S1 4. Interbody arthrodesis, L5-S1 5. Posterolateral arthrodesis, L5-S1 6. Use of locally harvested bone autograft 7. Use of non-structural bone allograft - rhBMP-2  SURGEON: Dr. Lisbeth RenshawNeelesh Navneet Schmuck, MD  ASSISTANT: Cindra PresumeVincent Costella, PA-C  ANESTHESIA: General Endotracheal  EBL: 750cc  SPECIMENS: None  DRAINS: None  COMPLICATIONS: None immediate  CONDITION: Hemodynamically stable to PACU  HISTORY: Larry Mora is a [age] y.o. male who has been followed in the outpatient clinic with back and primarily right leg pain related to spondylolisthesis with synovial cyst at L5-S1. He therefore elected to proceed with surgical decompression and fusion. Risks and benefits were reviewed and consent was obtained.  PROCEDURE IN DETAIL: After informed consent was obtained and witnessed, the patient was brought to the operating room. After induction of general anesthesia, the patient was positioned on the operative table in the prone position. All pressure points were meticulously padded. Incision was then marked out and prepped and draped in the usual sterile fashion.  After timeout was conducted, skin was infiltrated with local anesthetic. Skin incision was then made sharply and Bovie electrocautery was used to dissect the subcutaneous tissue until the lumbodorsal fascia was identified and incised. The muscle was then elevated in the subperiosteal plane and the L5 and S1 laminae and facet complexes were identified.  Self-retaining retractors were then placed.  At this point attention was turned to decompression. Complete L5 laminectomy with facetectomy was completed using a combination of Kerrison rongeurs and a high-speed drill.The right L5 nerve root was noted to be significantly compressed due to the spondylolisthesis. This was remedied after complete facetectomy and unroofing of the neural foramen.  Disc space was then identified, incised, and using a combination of shavers, curettes and rongeurs, complete discectomy was completed. Endplates were prepared, and 2 expandable 8mm cages were tapped into place.  Bone harvested during decompression was mixed with BMPand packed into the interspace prior to cage insertion. Good position was confirmed with fluoroscopy.  At this point, the L5 and S1 pedicle screws were drilled and tapped to 5.5 mm at L5 and 6.755mm at S1. Screws were then placed in L5 and S1. The lateral facet and transverse processes wer then decorticated with the high-speed drill in preparation for fusion. Rod was then placed, set screws placed and final tightened. Final AP and lateral fluoroscopic images confirmed good position.  The remaining BMP and bone graft were then placed into the lateral gutters for posterolateral arthrodesis.  The wound was then irrigated with copious amounts of antibiotic saline, then closed in standard fashion using a combination of interrupted 0 and 3-0 Vicryl stitches in the muscular, fascial, and subcutaneous layers. Skin was then closed using standard Dermabond. Sterile dressing was then applied. The patient was then transferred to the stretcher, extubated, and taken to the postanesthesia care unit in stable hemodynamic condition.  At the end of the case all sponge, needle, cottonoid, and instrument counts were correct.

## 2017-11-10 NOTE — Transfer of Care (Signed)
Immediate Anesthesia Transfer of Care Note  Patient: Larry Mora  Procedure(s) Performed: POSTERIOR LUMBAR INTERBODY FUSION LUMBAR FIVE- SACRAL ONE, INTERBODY ARTHRODESIS, NON-SEGMENTAL PEDICLE SCREW INSTRUMENTATION, POSTEROLATERAL FUSION (N/A Spine Lumbar)  Patient Location: PACU  Anesthesia Type:General  Level of Consciousness: awake, alert  and oriented  Airway & Oxygen Therapy: Patient Spontanous Breathing and Patient connected to nasal cannula oxygen  Post-op Assessment: Report given to RN, Post -op Vital signs reviewed and stable and Patient moving all extremities X 4  Post vital signs: Reviewed and stable  Last Vitals:  Vitals:   11/10/17 0613 11/10/17 0619  BP: (!) 151/90 (!) 149/89  Pulse: 73 82  Resp: 20 20  Temp: 37 C 37.1 C  SpO2: 98% 96%    Last Pain:  Vitals:   11/10/17 0621  TempSrc:   PainSc: 7       Patients Stated Pain Goal: 2 (11/10/17 [id number])  Complications: No apparent anesthesia complications

## 2017-11-10 NOTE — Anesthesia Postprocedure Evaluation (Signed)
Anesthesia Post Note  Patient: Larry Mora  Procedure(s) Performed: POSTERIOR LUMBAR INTERBODY FUSION LUMBAR FIVE- SACRAL ONE, INTERBODY ARTHRODESIS, NON-SEGMENTAL PEDICLE SCREW INSTRUMENTATION, POSTEROLATERAL FUSION (N/A Spine Lumbar)     Patient location during evaluation: PACU Anesthesia Type: General Level of consciousness: awake Pain management: pain level controlled Vital Signs Assessment: post-procedure vital signs reviewed and stable Respiratory status: spontaneous breathing Cardiovascular status: stable Postop Assessment: no apparent nausea or vomiting Anesthetic complications: no    Last Vitals:  Vitals:   11/10/17 1230 11/10/17 1234  BP:  112/74  Pulse: 98 98  Resp: 13 12  Temp:    SpO2: 93% 96%    Last Pain:  Vitals:   11/10/17 1235  TempSrc:   PainSc: 5    Pain Goal: Patients Stated Pain Goal: 2 (11/10/17 [id number])               [name] JR,[name]

## 2017-11-11 LAB — BASIC METABOLIC PANEL
Anion gap: 8 (ref 5–15)
BUN: 7 mg/dL (ref 6–20)
CO2: 26 mmol/L (ref 22–32)
Calcium: 8.6 mg/dL — ABNORMAL LOW (ref 8.9–10.3)
Chloride: 98 mmol/L — ABNORMAL LOW (ref 101–111)
Creatinine, Ser: 0.62 mg/dL (ref 0.61–1.24)
GFR calc Af Amer: >60 mL/min (ref >60)
GFR calc non Af Amer: >60 mL/min (ref >60)
Glucose, Bld: 124 mg/dL — ABNORMAL HIGH (ref 65–99)
Potassium: 3.6 mmol/L (ref 3.5–5.1)
Sodium: 132 mmol/L — ABNORMAL LOW (ref 135–145)

## 2017-11-11 LAB — CBC
HCT: 31.7 % — ABNORMAL LOW (ref 39.0–52.0)
Hemoglobin: 10.6 g/dL — ABNORMAL LOW (ref 13.0–17.0)
MCH: 29.3 pg (ref 26.0–34.0)
MCHC: 33.4 g/dL (ref 30.0–36.0)
MCV: 87.6 fL (ref 78.0–100.0)
Platelets: 232 10*3/uL (ref 150–400)
RBC: 3.62 MIL/uL — ABNORMAL LOW (ref 4.22–5.81)
RDW: 15.5 % (ref 11.5–15.5)
WBC: 16.4 10*3/uL — ABNORMAL HIGH (ref 4.0–10.5)

## 2017-11-11 LAB — GLUCOSE, CAPILLARY: Glucose-Capillary: 127 mg/dL — ABNORMAL HIGH (ref 65–99)

## 2017-11-11 LAB — PROTIME-INR
INR: 1.07
Prothrombin Time: 13.8 seconds (ref 11.4–15.2)

## 2017-11-11 LAB — APTT: aPTT: 30 seconds (ref 24–36)

## 2017-11-11 MED ORDER — METHOCARBAMOL 500 MG PO TABS: 500.0000 mg | ORAL_TABLET | Freq: Three times a day (TID) | ORAL | 0 refills | Status: AC | PRN

## 2017-11-11 MED ORDER — OXYCODONE HCL 10 MG PO TABS: 10.0000 mg | ORAL_TABLET | Freq: Four times a day (QID) | ORAL | 0 refills | Status: AC | PRN

## 2017-11-11 NOTE — Progress Notes (Signed)
Patient alert and oriented, mae's well, voiding adequate amount of urine, swallowing without difficulty, no c/o pain at time of discharge. Patient discharged home with family. Script and discharged instructions given to patient. Patient and family stated understanding of instructions given. Patient has an appointment with Dr. Nundkumar  

## 2017-11-11 NOTE — Evaluation (Signed)
Occupational Therapy Evaluation Patient Details Name: Larry Mora MRN: [id number] DOB: 1961/04/18 Today's Date: 11/11/2017    History of Present Illness Pt is a 56 y/o male s/p L5-S1 PLIF. PMH including but not limited to DM and HTN.   Clinical Impression   Patient evaluated by Occupational Therapy with no further acute OT needs identified. All education has been completed and the patient has no further questions. See below for any follow-up Occupational Therapy or equipment needs. OT to sign off. Thank you for referral.      Follow Up Recommendations  No OT follow up    Equipment Recommendations  3 in 1 bedside commode    Recommendations for Other Services       Precautions / Restrictions Precautions Precautions: Back Precaution Booklet Issued: Yes (comment) Precaution Comments: PT reviewed 3/3 back precautions and log roll technique with pt throughout Required Braces or Orthoses: Spinal Brace Spinal Brace: Lumbar corset;Applied in sitting position Restrictions Weight Bearing Restrictions: No      Mobility Bed Mobility Overal bed mobility: Modified Independent Bed Mobility: Rolling;Sidelying to Sit Rolling: Supervision Sidelying to sit: Supervision       General bed mobility comments: cues to keep body aligned  Transfers Overall transfer level: Modified independent Equipment used: None Transfers: Sit to/from Stand Sit to Stand: Supervision         General transfer comment: for safety    Balance Overall balance assessment: Needs assistance Sitting-balance support: Feet supported;No upper extremity supported Sitting balance-Leahy Scale: Normal     Standing balance support: No upper extremity supported Standing balance-Leahy Scale: Good                             ADL either performed or assessed with clinical judgement   ADL Overall ADL's : Independent                                       General ADL Comments:  simulated tub transfer and discussed in depth placement of grab bars . pt states he knows a Music therapistcarpenter that can install them for him into the 4 by 4 tiles. Pt educated on bed sequence with precations. pt don doff brace mod I. pt can cross bil le and reach feet . Educated to dress R LE first due to decr ROM compared to L LE   Back handout provided and reviewed adls in detail. Pt educated on: clothing between brace, never sleep in brace, set an alarm at night for medication, avoid sitting for long periods of time, correct bed positioning for sleeping, correct sequence for bed mobility, avoiding lifting more than 5 pounds and never wash directly over incision. All education is complete and patient indicates understanding.    Vision Baseline Vision/History: Wears glasses Patient Visual Report: No change from baseline       Perception     Praxis      Pertinent Vitals/Pain Pain Assessment: Faces Faces Pain Scale: Hurts a little bit Pain Location: back Pain Descriptors / Indicators: Sore Pain Intervention(s): Premedicated before session;Monitored during session;Repositioned     Hand Dominance Right   Extremity/Trunk Assessment Upper Extremity Assessment Upper Extremity Assessment: Overall WFL for tasks assessed   Lower Extremity Assessment Lower Extremity Assessment: Overall WFL for tasks assessed   Cervical / Trunk Assessment Cervical / Trunk Assessment: Other exceptions Cervical / Trunk Exceptions: back surgery  Communication Communication Communication: No difficulties   Cognition Arousal/Alertness: Awake/alert Behavior During Therapy: WFL for tasks assessed/performed Overall Cognitive Status: Within Functional Limits for tasks assessed                                     General Comments       Exercises     Shoulder Instructions      Home Living Family/patient expects to be discharged to:: Private residence Living Arrangements: Spouse/significant  other Available Help at Discharge: Family;Available 24 hours/day Type of Home: House Home Access: Stairs to enter Entergy CorporationEntrance Stairs-Number of Steps: 3 Entrance Stairs-Rails: None Home Layout: One level     Bathroom Shower/Tub: Chief Strategy OfficerTub/shower unit   Bathroom Toilet: Standard     Home Equipment: [employer] ServiceMaster CompanyCane - single point          Prior Functioning/Environment Level of Independence: Independent                 OT Problem List:        OT Treatment/Interventions:      OT Goals(Current goals can be found in the care plan section) Acute Rehab OT Goals Patient Stated Goal: home Potential to Achieve Goals: Good  OT Frequency:     Barriers to D/C:            Co-evaluation              AM-PAC PT "6 Clicks" Daily Activity     Outcome Measure Help from another person eating meals?: None Help from another person taking care of personal grooming?: None Help from another person toileting, which includes using toliet, bedpan, or urinal?: None Help from another person bathing (including washing, rinsing, drying)?: None Help from another person to put on and taking off regular upper body clothing?: None Help from another person to put on and taking off regular lower body clothing?: None 6 Click Score: 24   End of Session Equipment Utilized During Treatment: Back brace Nurse Communication: Mobility status;Precautions  Activity Tolerance: Patient tolerated treatment well Patient left: Other (comment)(walking in the hallway of the unit)  OT Visit Diagnosis: Unsteadiness on feet (R26.81)                Time: 0981-19140847-0901 OT Time Calculation (min): 14 min Charges:  OT General Charges $OT Visit: 1 Visit OT Evaluation $OT Eval Moderate Complexity: 1 Mod G-Codes: OT G-codes **NOT FOR INPATIENT CLASS** Functional Assessment Tool Used: Clinical judgement Functional Limitation: Self care Self Care Current Status (N8295(G8987): 0 percent impaired, limited or restricted Self Care Discharge  Status (A2130(G8989): 0 percent impaired, limited or restricted    Mateo FlowJones, [name]   OTR/L Pager: [phone] Office: [phone] .   Boone MasterJones, [name] Filler B 11/11/2017, 11:20 AM

## 2017-11-11 NOTE — Progress Notes (Signed)
No issues overnight. Pt has appropriate back pain, no further leg pain.   EXAM:  BP 122/76 (BP Location: Left Arm)   Pulse 89   Temp 98 F (36.7 C) (Axillary)   Resp 18   Ht 5\' 8"  (1.727 m)   Wt 96.2 kg (212 lb)   SpO2 100%   BMI 32.23 kg/m   Awake, alert, oriented  Speech fluent, appropriate  CN grossly intact  5/5 BUE/BLE  Wound c/d/i  IMPRESSION:  56 y.o. male POD#1 L5-S1 PLIF doing well  PLAN: - d/c home today

## 2017-11-11 NOTE — Discharge Summary (Signed)
Physician Discharge Summary  Patient ID: Larry Mora MRN: [id number] DOB/AGE: March 04, 1961 56 y.o.  Admit date: 11/10/2017 Discharge date: 11/11/2017  Admission Diagnoses:  Spondylolisthesis L5-S1  Discharge Diagnoses:  Same Active Problems:   Spondylolisthesis of lumbar region   Discharged Condition: Stable  Hospital Course:  Larry Mora is a 56 y.o. male admitted after elective L5-S1 decompression/fusion. He was at baseline postop with resolution of his leg pain and appropriate back pain. He was ambulating well, tolerating diet, voiding normally and requested discharge home.  Treatments: Surgery - L5-S1 PLIF  Discharge Exam: Blood pressure (!) 141/84, pulse 95, temperature 97.9 F (36.6 C), resp. rate 18, height 5\' 8"  (1.727 m), weight 96.2 kg (212 lb), SpO2 100 %. Awake, alert, oriented Speech fluent, appropriate CN grossly intact 5/5 BUE/BLE Wound c/d/i  Disposition: Home  Discharge Instructions    Call MD for:  redness, tenderness, or signs of infection (pain, swelling, redness, odor or green/yellow discharge around incision site)   Complete by:  As directed    Call MD for:  temperature >100.4   Complete by:  As directed    Diet - low sodium heart healthy   Complete by:  As directed    Discharge instructions   Complete by:  As directed    Walk at home as much as possible, at least 4 times / day   Incentive spirometry RT   Complete by:  As directed    Increase activity slowly   Complete by:  As directed    Lifting restrictions   Complete by:  As directed    No lifting > 10 lbs   May shower / Bathe   Complete by:  As directed    48 hours after surgery   May walk up steps   Complete by:  As directed    No dressing needed   Complete by:  As directed    Other Restrictions   Complete by:  As directed    No bending/twisting at waist     Allergies as of 11/11/2017   No Known Allergies     Medication List    TAKE these medications   ALEVE PM 220-25  MG Tabs Generic drug:  Naproxen Sod-Diphenhydramine Take 2 tablets by mouth at bedtime as needed (for sleep).   atorvastatin 40 MG tablet Commonly known as:  LIPITOR Take 40 mg by mouth daily.   cyanocobalamin 1000 MCG/ML injection Commonly known as:  (VITAMIN B-12) Inject 1,000 mcg into the muscle every 30 (thirty) days.   escitalopram 10 MG tablet Commonly known as:  LEXAPRO Take 10 mg by mouth daily.   KP MENS DAILY PACK PO Take 1 tablet by mouth daily.   LANTUS SOLOSTAR 100 UNIT/ML Solostar Pen Generic drug:  Insulin Glargine Inject 4 Units into the skin daily.   latanoprost 0.005 % ophthalmic solution Commonly known as:  XALATAN Place 1 drop into both eyes at bedtime.   lisinopril 20 MG tablet Commonly known as:  PRINIVIL,ZESTRIL Take 20 mg by mouth daily.   methocarbamol 500 MG tablet Commonly known as:  ROBAXIN Take 1 tablet (500 mg total) by mouth every 8 (eight) hours as needed for muscle spasms.   metoprolol succinate 100 MG 24 hr tablet Commonly known as:  TOPROL-XL Take 100 mg by mouth daily.   Oxycodone HCl 10 MG Tabs Take 1 tablet (10 mg total) by mouth every 6 (six) hours as needed for severe pain ((score 7 to 10)).      Follow-up  Information    Lisbeth RenshawNundkumar, Chance Karam, MD Follow up in 3 week(s).   Specialty:  Neurosurgery Contact information: [address] [place] [address] [phone]           Signed: Lisbeth RenshawUNDKUMAR, Gawain Crombie, Salena SanerC 11/11/2017, 8:01 AM

## 2017-11-11 NOTE — Progress Notes (Signed)
Physical Therapy Treatment Patient Details Name: Larry Mora MRN: [id number] DOB: [birth year] Today's Date: 11/11/2017    History of Present Illness Pt is a 56 y/o male s/p L5-S1 PLIF. PMH including but not limited to DM and HTN.    PT Comments    Pt making good progress with mobility. PT will continue to follow acutely to ensure a safe d/c home.   Follow Up Recommendations  No PT follow up;Supervision - Intermittent     Equipment Recommendations  None recommended by PT    Recommendations for Other Services       Precautions / Restrictions Precautions Precautions: Back Precaution Booklet Issued: Yes (comment) Precaution Comments: PT reviewed 3/3 back precautions and log roll technique with pt throughout Required Braces or Orthoses: Spinal Brace Spinal Brace: Lumbar corset;Applied in sitting position Restrictions Weight Bearing Restrictions: No    Mobility  Bed Mobility Overal bed mobility: Needs Assistance Bed Mobility: Rolling;Sidelying to Sit Rolling: Supervision Sidelying to sit: Supervision       General bed mobility comments: cueing for log roll technique  Transfers Overall transfer level: Needs assistance Equipment used: None Transfers: Sit to/from Stand Sit to Stand: Supervision         General transfer comment: for safety  Ambulation/Gait Ambulation/Gait assistance: Supervision Ambulation Distance (Feet): 500 Feet Assistive device: None Gait Pattern/deviations: Step-through pattern;Decreased stride length Gait velocity: decreased Gait velocity interpretation: Below normal speed for age/gender General Gait Details: pt with no instability or LOB, supervision for safety   Stairs Stairs: Yes   Stair Management: One rail Right;Alternating pattern;Forwards Number of Stairs: 10 General stair comments: no instability or LOB, supervision for safety  Wheelchair Mobility    Modified Rankin (Stroke Patients Only)       Balance Overall  balance assessment: Needs assistance Sitting-balance support: Feet supported;No upper extremity supported Sitting balance-Leahy Scale: Good     Standing balance support: No upper extremity supported Standing balance-Leahy Scale: Fair                              Cognition Arousal/Alertness: Awake/alert Behavior During Therapy: WFL for tasks assessed/performed Overall Cognitive Status: Within Functional Limits for tasks assessed                                        Exercises      General Comments        Pertinent Vitals/Pain Pain Assessment: Faces Faces Pain Scale: Hurts a little bit Pain Location: back Pain Descriptors / Indicators: Sore Pain Intervention(s): Monitored during session;Repositioned    Home Living                      Prior Function            PT Goals (current goals can now be found in the care plan section) Acute Rehab PT Goals PT Goal Formulation: With patient Time For Goal Achievement: 11/17/17 Potential to Achieve Goals: Good Progress towards PT goals: Progressing toward goals    Frequency    Min 5X/week      PT Plan Current plan remains appropriate    Co-evaluation              AM-PAC PT "6 Clicks" Daily Activity  Outcome Measure  Difficulty turning over in bed (including adjusting bedclothes, sheets and blankets)?: None Difficulty moving from  lying on back to sitting on the side of the bed? : None Difficulty sitting down on and standing up from a chair with arms (e.g., wheelchair, bedside commode, etc,.)?: None Help needed moving to and from a bed to chair (including a wheelchair)?: None Help needed walking in hospital room?: None Help needed climbing 3-5 steps with a railing? : None 6 Click Score: 24    End of Session Equipment Utilized During Treatment: Back brace Activity Tolerance: Patient tolerated treatment well Patient left: in bed;with call bell/phone within reach Nurse  Communication: Mobility status PT Visit Diagnosis: Unsteadiness on feet (R26.81)     Time: [id number] PT Time Calculation (min) (ACUTE ONLY): 13 min  Charges:  $Gait Training: 8-22 mins                    G Codes:       FalconJennifer Conley Pawling, [place], [place] [phone]    Alessandra BevelsJennifer M Casaundra Takacs 11/11/2017, 8:47 AM

## 2017-12-07 DIAGNOSIS — M7138 Other bursal cyst, other site: Secondary | ICD-10-CM | POA: Diagnosis not present

## 2017-12-15 DIAGNOSIS — E1169 Type 2 diabetes mellitus with other specified complication: Secondary | ICD-10-CM | POA: Diagnosis not present

## 2017-12-15 DIAGNOSIS — E669 Obesity, unspecified: Secondary | ICD-10-CM | POA: Diagnosis not present

## 2017-12-15 DIAGNOSIS — Z0001 Encounter for general adult medical examination with abnormal findings: Secondary | ICD-10-CM | POA: Diagnosis not present

## 2017-12-15 DIAGNOSIS — I10 Essential (primary) hypertension: Secondary | ICD-10-CM | POA: Diagnosis not present

## 2018-01-05 DIAGNOSIS — E119 Type 2 diabetes mellitus without complications: Secondary | ICD-10-CM | POA: Diagnosis not present

## 2018-02-22 DIAGNOSIS — I10 Essential (primary) hypertension: Secondary | ICD-10-CM | POA: Diagnosis not present

## 2018-02-22 DIAGNOSIS — F1020 Alcohol dependence, uncomplicated: Secondary | ICD-10-CM | POA: Diagnosis not present

## 2018-02-22 DIAGNOSIS — E1169 Type 2 diabetes mellitus with other specified complication: Secondary | ICD-10-CM | POA: Diagnosis not present

## 2018-02-22 DIAGNOSIS — K709 Alcoholic liver disease, unspecified: Secondary | ICD-10-CM | POA: Diagnosis not present

## 2018-04-07 DIAGNOSIS — H401131 Primary open-angle glaucoma, bilateral, mild stage: Secondary | ICD-10-CM | POA: Diagnosis not present

## 2018-05-10 DIAGNOSIS — H401131 Primary open-angle glaucoma, bilateral, mild stage: Secondary | ICD-10-CM | POA: Diagnosis not present

## 2018-07-19 DIAGNOSIS — I10 Essential (primary) hypertension: Secondary | ICD-10-CM | POA: Diagnosis not present

## 2018-07-19 DIAGNOSIS — M4310 Spondylolisthesis, site unspecified: Secondary | ICD-10-CM | POA: Diagnosis not present

## 2018-07-19 DIAGNOSIS — Z6836 Body mass index (BMI) 36.0-36.9, adult: Secondary | ICD-10-CM | POA: Diagnosis not present

## 2018-08-10 DIAGNOSIS — H401131 Primary open-angle glaucoma, bilateral, mild stage: Secondary | ICD-10-CM | POA: Diagnosis not present

## 2018-12-07 DIAGNOSIS — H401131 Primary open-angle glaucoma, bilateral, mild stage: Secondary | ICD-10-CM | POA: Diagnosis not present

## 2018-12-16 IMAGING — RF DG LUMBAR SPINE 2-3V
1 series · 4 of 4 positions shown · non-contrast
Comparison: None in PACs

CLINICAL DATA: L5-S1 PLIF.  50.5 seconds fluoro time

EXAM:
LUMBAR SPINE - 2-3 VIEW; DG C-ARM 61-120 MIN

[Series 1: run · 4 of 4 slices shown]
[im 1/4]
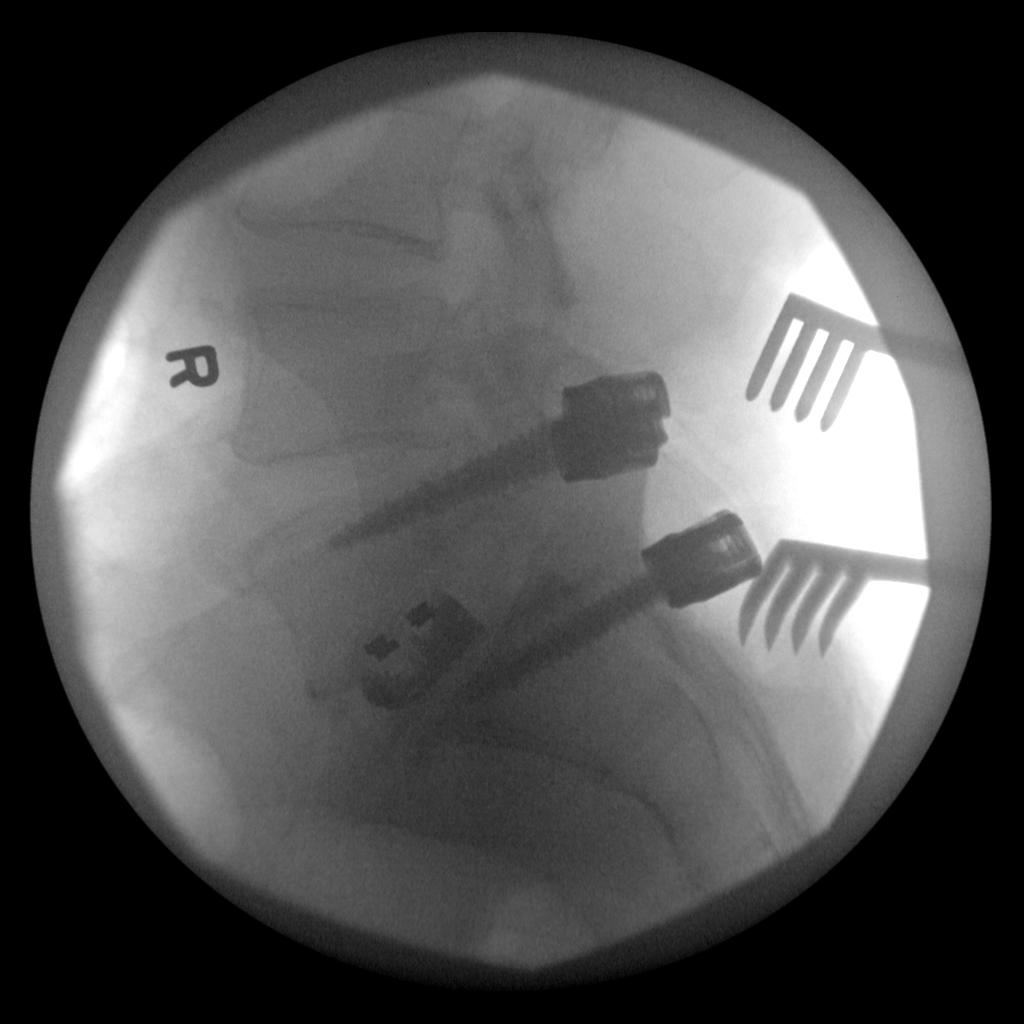
[im 2/4]
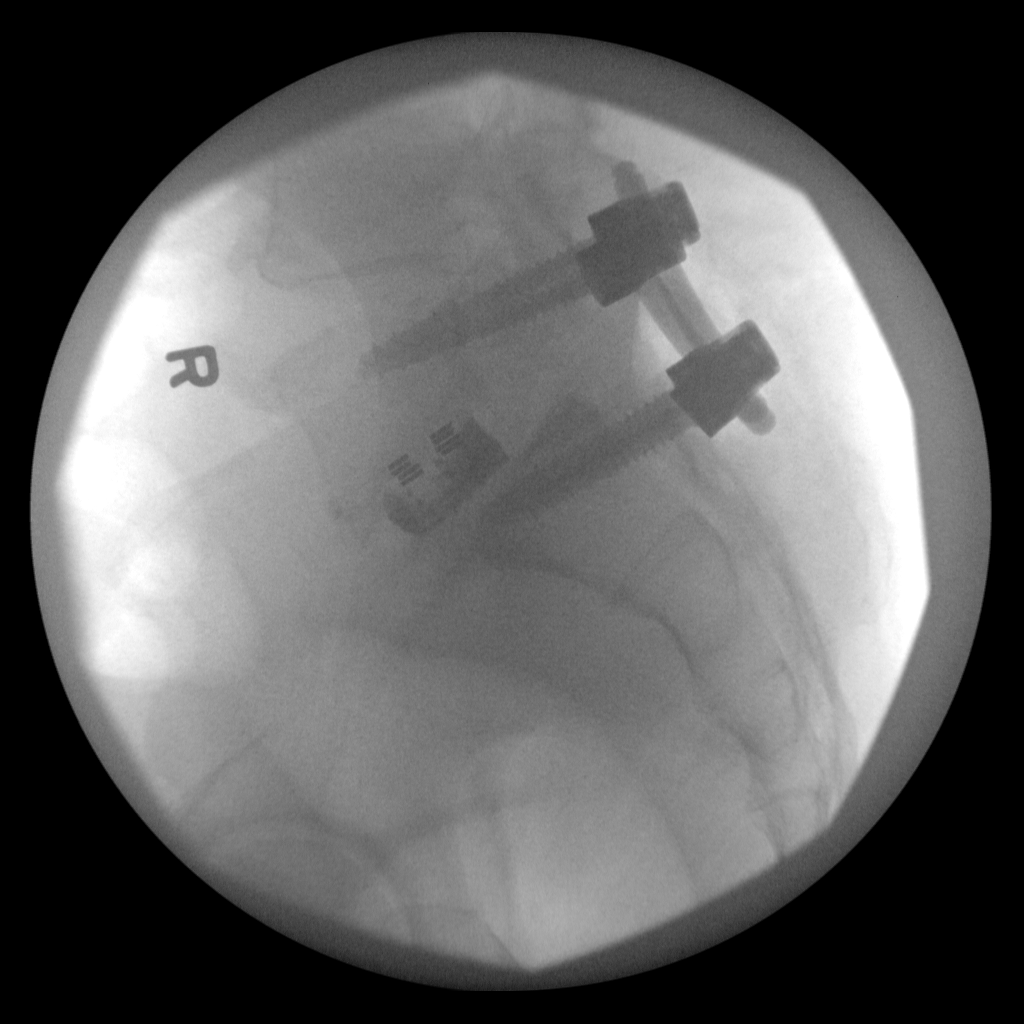
[im 3/4]
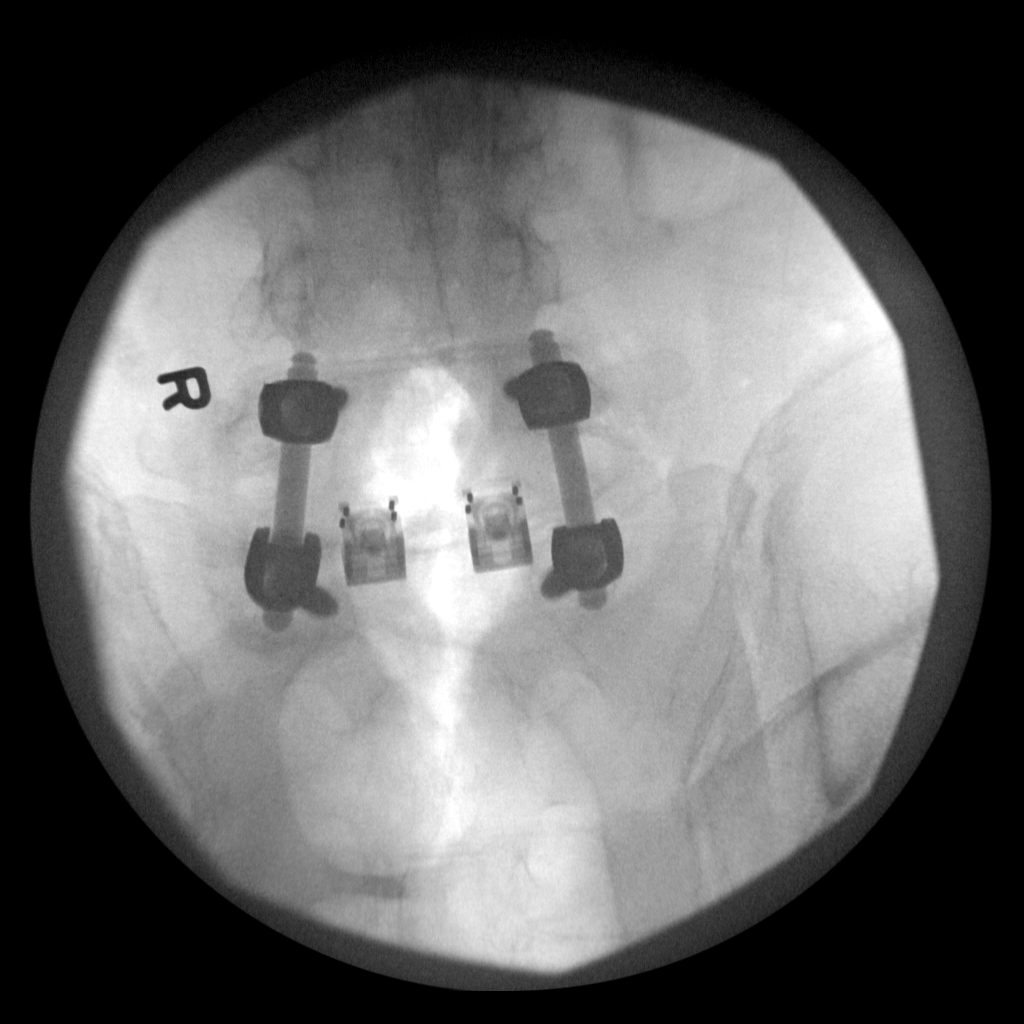
[im 4/4]
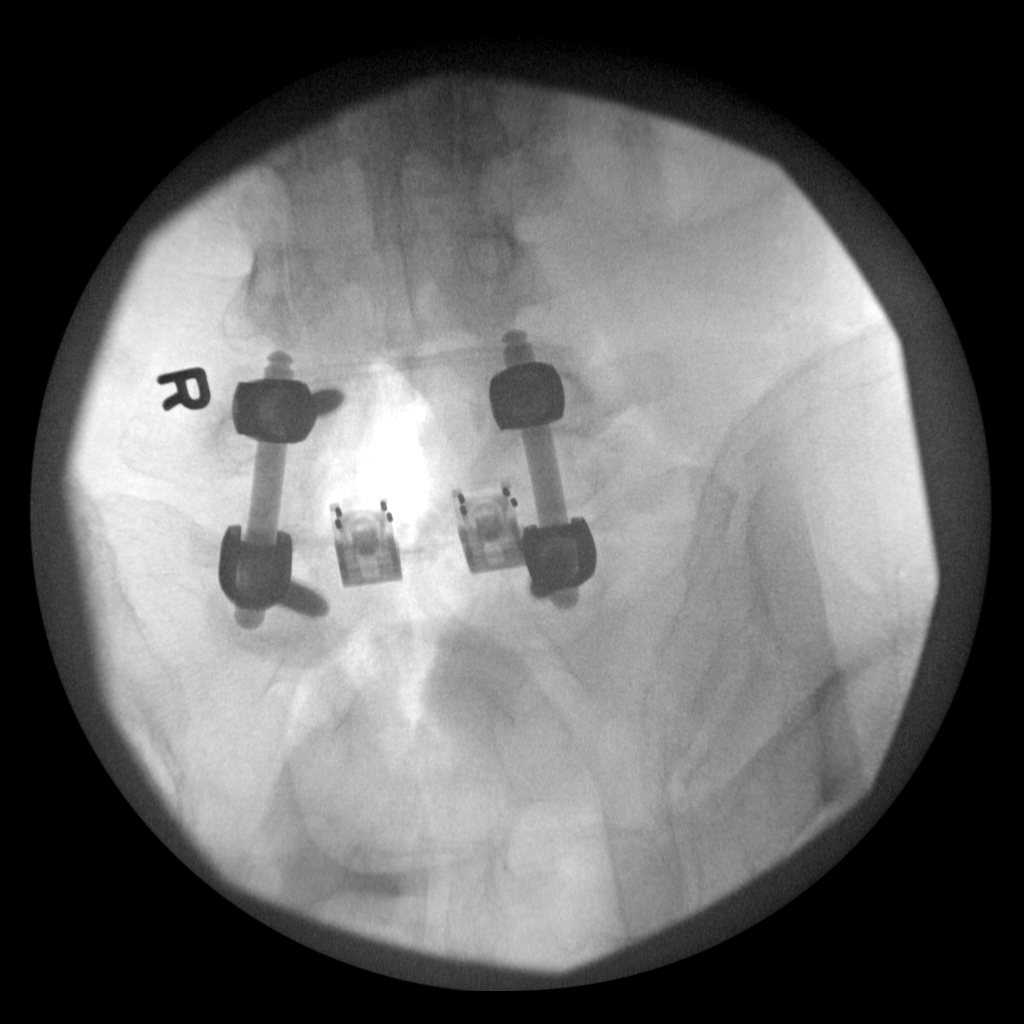

[4 of 4 positions shown; findings below may reference images not displayed]

FINDINGS: Four fluoro spot films are submitted. These reveal placement of an
intradiscal device at L5-S1 as well as pedicle screws and connecting
rods. There is grade 1 anterolisthesis of L5 with respect to S1.
IMPRESSION: Intraoperative fluorospot series revealing PLIF at L5-S1. No
immediate postprocedure complication is observed.

## 2019-01-10 DIAGNOSIS — E782 Mixed hyperlipidemia: Secondary | ICD-10-CM | POA: Diagnosis not present

## 2019-01-10 DIAGNOSIS — E1169 Type 2 diabetes mellitus with other specified complication: Secondary | ICD-10-CM | POA: Diagnosis not present

## 2019-01-10 DIAGNOSIS — H409 Unspecified glaucoma: Secondary | ICD-10-CM | POA: Diagnosis not present

## 2019-01-10 DIAGNOSIS — Z23 Encounter for immunization: Secondary | ICD-10-CM | POA: Diagnosis not present

## 2019-01-10 DIAGNOSIS — I10 Essential (primary) hypertension: Secondary | ICD-10-CM | POA: Diagnosis not present

## 2019-02-10 DIAGNOSIS — E782 Mixed hyperlipidemia: Secondary | ICD-10-CM | POA: Diagnosis not present

## 2019-02-10 DIAGNOSIS — E1169 Type 2 diabetes mellitus with other specified complication: Secondary | ICD-10-CM | POA: Diagnosis not present

## 2019-02-10 DIAGNOSIS — Z1331 Encounter for screening for depression: Secondary | ICD-10-CM | POA: Diagnosis not present

## 2019-02-10 DIAGNOSIS — H409 Unspecified glaucoma: Secondary | ICD-10-CM | POA: Diagnosis not present

## 2019-02-10 DIAGNOSIS — I10 Essential (primary) hypertension: Secondary | ICD-10-CM | POA: Diagnosis not present

## 2019-05-18 DIAGNOSIS — J309 Allergic rhinitis, unspecified: Secondary | ICD-10-CM | POA: Diagnosis not present

## 2019-05-18 DIAGNOSIS — E1169 Type 2 diabetes mellitus with other specified complication: Secondary | ICD-10-CM | POA: Diagnosis not present

## 2019-05-18 DIAGNOSIS — E782 Mixed hyperlipidemia: Secondary | ICD-10-CM | POA: Diagnosis not present

## 2019-05-18 DIAGNOSIS — I10 Essential (primary) hypertension: Secondary | ICD-10-CM | POA: Diagnosis not present

## 2019-09-19 DIAGNOSIS — E538 Deficiency of other specified B group vitamins: Secondary | ICD-10-CM | POA: Diagnosis not present

## 2019-09-19 DIAGNOSIS — J309 Allergic rhinitis, unspecified: Secondary | ICD-10-CM | POA: Diagnosis not present

## 2019-09-19 DIAGNOSIS — E1169 Type 2 diabetes mellitus with other specified complication: Secondary | ICD-10-CM | POA: Diagnosis not present

## 2019-09-19 DIAGNOSIS — E782 Mixed hyperlipidemia: Secondary | ICD-10-CM | POA: Diagnosis not present

## 2019-09-19 DIAGNOSIS — I10 Essential (primary) hypertension: Secondary | ICD-10-CM | POA: Diagnosis not present

## 2019-09-19 DIAGNOSIS — Z23 Encounter for immunization: Secondary | ICD-10-CM | POA: Diagnosis not present

## 2023-09-02 DIAGNOSIS — E785 Hyperlipidemia, unspecified: Secondary | ICD-10-CM | POA: Diagnosis not present

## 2023-09-02 DIAGNOSIS — E1169 Type 2 diabetes mellitus with other specified complication: Secondary | ICD-10-CM | POA: Diagnosis not present

## 2023-09-02 DIAGNOSIS — Z125 Encounter for screening for malignant neoplasm of prostate: Secondary | ICD-10-CM | POA: Diagnosis not present

## 2023-09-02 DIAGNOSIS — F331 Major depressive disorder, recurrent, moderate: Secondary | ICD-10-CM | POA: Diagnosis not present
